# Patient Record
Sex: Female | Born: 1995
Health system: Southern US, Community
[De-identification: ages and names within clinical notes are randomized; demographics above are authoritative.]

## PROBLEM LIST (undated history)

## (undated) DIAGNOSIS — Z789 Other specified health status: Secondary | ICD-10-CM

## (undated) HISTORY — DX: Other specified health status: Z78.9

---

## 2019-08-26 ENCOUNTER — Emergency Department (HOSPITAL_COMMUNITY): Payer: Self-pay

## 2019-08-26 ENCOUNTER — Ambulatory Visit: Payer: Self-pay | Admitting: Orthopedic Surgery

## 2019-08-26 ENCOUNTER — Other Ambulatory Visit: Payer: Self-pay

## 2019-08-26 ENCOUNTER — Emergency Department (HOSPITAL_COMMUNITY): Payer: Self-pay | Admitting: Anesthesiology

## 2019-08-26 ENCOUNTER — Observation Stay (HOSPITAL_COMMUNITY)
Admission: EM | Admit: 2019-08-26 | Discharge: 2019-08-27 | Disposition: A | Discharge status: Home / Self Care | Payer: Self-pay | Attending: Orthopedic Surgery | Admitting: Orthopedic Surgery

## 2019-08-26 ENCOUNTER — Encounter (HOSPITAL_COMMUNITY): Payer: Self-pay

## 2019-08-26 ENCOUNTER — Encounter (HOSPITAL_COMMUNITY)
Admission: EM | Disposition: A | Discharge status: Home / Self Care | Payer: Self-pay | Source: Home / Self Care | Attending: Emergency Medicine

## 2019-08-26 DIAGNOSIS — W228XXA Striking against or struck by other objects, initial encounter: Secondary | ICD-10-CM | POA: Insufficient documentation

## 2019-08-26 DIAGNOSIS — Z20828 Contact with and (suspected) exposure to other viral communicable diseases: Secondary | ICD-10-CM | POA: Insufficient documentation

## 2019-08-26 DIAGNOSIS — Z23 Encounter for immunization: Secondary | ICD-10-CM | POA: Insufficient documentation

## 2019-08-26 DIAGNOSIS — S82402A Unspecified fracture of shaft of left fibula, initial encounter for closed fracture: Secondary | ICD-10-CM | POA: Diagnosis present

## 2019-08-26 DIAGNOSIS — Y990 Civilian activity done for income or pay: Secondary | ICD-10-CM | POA: Diagnosis not present

## 2019-08-26 DIAGNOSIS — S82209A Unspecified fracture of shaft of unspecified tibia, initial encounter for closed fracture: Secondary | ICD-10-CM

## 2019-08-26 DIAGNOSIS — S82202A Unspecified fracture of shaft of left tibia, initial encounter for closed fracture: Principal | ICD-10-CM | POA: Diagnosis present

## 2019-08-26 HISTORY — PX: TIBIA IM NAIL INSERTION: SHX2516

## 2019-08-26 LAB — CBC WITH DIFFERENTIAL/PLATELET
Abs Immature Granulocytes: 0.04 10*3/uL (ref 0.00–0.07)
Basophils Absolute: 0 10*3/uL (ref 0.0–0.1)
Basophils Relative: 0 %
Eosinophils Absolute: 0.1 10*3/uL (ref 0.0–0.5)
Eosinophils Relative: 1 %
HCT: 39.3 % (ref 36.0–46.0)
Hemoglobin: 12.9 g/dL (ref 12.0–15.0)
Immature Granulocytes: 0 %
Lymphocytes Relative: 13 %
Lymphs Abs: 1.5 10*3/uL (ref 0.7–4.0)
MCH: 29.5 pg (ref 26.0–34.0)
MCHC: 32.8 g/dL (ref 30.0–36.0)
MCV: 89.7 fL (ref 80.0–100.0)
Monocytes Absolute: 0.6 10*3/uL (ref 0.1–1.0)
Monocytes Relative: 5 %
Neutro Abs: 9.1 10*3/uL — ABNORMAL HIGH (ref 1.7–7.7)
Neutrophils Relative %: 81 %
Platelets: 309 10*3/uL (ref 150–400)
RBC: 4.38 MIL/uL (ref 3.87–5.11)
RDW: 13.4 % (ref 11.5–15.5)
WBC: 11.3 10*3/uL — ABNORMAL HIGH (ref 4.0–10.5)
nRBC: 0 % (ref 0.0–0.2)

## 2019-08-26 LAB — PROTIME-INR
INR: 1 (ref 0.8–1.2)
Prothrombin Time: 13.1 seconds (ref 11.4–15.2)

## 2019-08-26 LAB — URINALYSIS, ROUTINE W REFLEX MICROSCOPIC
Bilirubin Urine: NEGATIVE
Glucose, UA: NEGATIVE mg/dL
Ketones, ur: NEGATIVE mg/dL
Nitrite: NEGATIVE
Protein, ur: NEGATIVE mg/dL
Specific Gravity, Urine: 1.017 (ref 1.005–1.030)
pH: 6 (ref 5.0–8.0)

## 2019-08-26 LAB — BASIC METABOLIC PANEL
Anion gap: 9 (ref 5–15)
BUN: 12 mg/dL (ref 6–20)
CO2: 23 mmol/L (ref 22–32)
Calcium: 8.6 mg/dL — ABNORMAL LOW (ref 8.9–10.3)
Chloride: 104 mmol/L (ref 98–111)
Creatinine, Ser: 0.67 mg/dL (ref 0.44–1.00)
GFR calc Af Amer: >60 mL/min (ref >60)
GFR calc non Af Amer: >60 mL/min (ref >60)
Glucose, Bld: 101 mg/dL — ABNORMAL HIGH (ref 70–99)
Potassium: 3.6 mmol/L (ref 3.5–5.1)
Sodium: 136 mmol/L (ref 135–145)

## 2019-08-26 LAB — I-STAT BETA HCG BLOOD, ED (MC, WL, AP ONLY): I-stat hCG, quantitative: <5 m[IU]/mL (ref <5)

## 2019-08-26 LAB — SARS CORONAVIRUS 2 BY RT PCR (HOSPITAL ORDER, PERFORMED IN ~~LOC~~ HOSPITAL LAB): SARS Coronavirus 2: NEGATIVE

## 2019-08-26 LAB — SAMPLE TO BLOOD BANK

## 2019-08-26 SURGERY — INSERTION, INTRAMEDULLARY ROD, TIBIA
Anesthesia: General | Laterality: Left

## 2019-08-26 MED ORDER — ROCURONIUM BROMIDE 10 MG/ML (PF) SYRINGE
PREFILLED_SYRINGE | INTRAVENOUS | Status: DC | PRN
  Administered 2019-08-26: 100 mg via INTRAVENOUS

## 2019-08-26 MED ORDER — ONDANSETRON HCL 4 MG/2ML IJ SOLN
INTRAMUSCULAR | Status: AC
  Filled 2019-08-26: qty 2

## 2019-08-26 MED ORDER — ONDANSETRON HCL 4 MG PO TABS: 4.0000 mg | ORAL_TABLET | Freq: Four times a day (QID) | ORAL | Status: DC | PRN

## 2019-08-26 MED ORDER — ENOXAPARIN SODIUM 40 MG/0.4ML ~~LOC~~ SOLN
40.0000 mg | SUBCUTANEOUS | Status: DC
  Administered 2019-08-27: 40 mg via SUBCUTANEOUS
  Filled 2019-08-26: qty 0.4

## 2019-08-26 MED ORDER — BISACODYL 10 MG RE SUPP: 10.0000 mg | Freq: Every day | RECTAL | Status: DC | PRN

## 2019-08-26 MED ORDER — KETOROLAC TROMETHAMINE 30 MG/ML IJ SOLN
INTRAMUSCULAR | Status: DC | PRN
  Administered 2019-08-26: 30 mg via INTRAVENOUS

## 2019-08-26 MED ORDER — HYDROMORPHONE HCL 1 MG/ML IJ SOLN
1.0000 mg | Freq: Once | INTRAMUSCULAR | Status: AC
  Administered 2019-08-26: 14:00:00 1 mg via INTRAVENOUS
  Filled 2019-08-26: qty 1

## 2019-08-26 MED ORDER — HYDROMORPHONE HCL 1 MG/ML IJ SOLN
0.5000 mg | INTRAMUSCULAR | Status: DC | PRN
  Administered 2019-08-26: 1 mg via INTRAVENOUS
  Filled 2019-08-26: qty 1

## 2019-08-26 MED ORDER — MAGNESIUM CITRATE PO SOLN: 1.0000 | Freq: Once | ORAL | Status: DC | PRN

## 2019-08-26 MED ORDER — SODIUM CHLORIDE 0.9 % IV BOLUS
1000.0000 mL | Freq: Once | INTRAVENOUS | Status: AC
  Administered 2019-08-26: 10:00:00 1000 mL via INTRAVENOUS

## 2019-08-26 MED ORDER — MIDAZOLAM HCL 2 MG/2ML IJ SOLN
INTRAMUSCULAR | Status: AC
  Filled 2019-08-26: qty 2

## 2019-08-26 MED ORDER — SODIUM CHLORIDE 0.9 % IV SOLN
INTRAVENOUS | Status: DC
  Administered 2019-08-27: 06:00:00 via INTRAVENOUS

## 2019-08-26 MED ORDER — FENTANYL CITRATE (PF) 250 MCG/5ML IJ SOLN
INTRAMUSCULAR | Status: DC | PRN
  Administered 2019-08-26 (×2): 100 ug via INTRAVENOUS
  Administered 2019-08-26 (×2): 50 ug via INTRAVENOUS
  Administered 2019-08-26: 100 ug via INTRAVENOUS

## 2019-08-26 MED ORDER — OXYCODONE HCL 5 MG PO TABS
10.0000 mg | ORAL_TABLET | ORAL | Status: DC | PRN
  Administered 2019-08-27: 10 mg via ORAL
  Filled 2019-08-26: qty 2

## 2019-08-26 MED ORDER — ONDANSETRON HCL 4 MG/2ML IJ SOLN
4.0000 mg | Freq: Once | INTRAMUSCULAR | Status: AC
  Administered 2019-08-26: 10:00:00 4 mg via INTRAVENOUS
  Filled 2019-08-26: qty 2

## 2019-08-26 MED ORDER — DEXAMETHASONE SODIUM PHOSPHATE 10 MG/ML IJ SOLN
INTRAMUSCULAR | Status: AC
  Filled 2019-08-26: qty 1

## 2019-08-26 MED ORDER — LIDOCAINE 2% (20 MG/ML) 5 ML SYRINGE
INTRAMUSCULAR | Status: DC | PRN
  Administered 2019-08-26: 60 mg via INTRAVENOUS

## 2019-08-26 MED ORDER — OXYCODONE HCL 5 MG PO TABS
5.0000 mg | ORAL_TABLET | Freq: Once | ORAL | Status: AC | PRN
  Administered 2019-08-26: 5 mg via ORAL

## 2019-08-26 MED ORDER — OXYCODONE HCL 5 MG PO TABS: 5.0000 mg | ORAL_TABLET | ORAL | Status: DC | PRN

## 2019-08-26 MED ORDER — KETOROLAC TROMETHAMINE 30 MG/ML IJ SOLN
INTRAMUSCULAR | Status: AC
  Filled 2019-08-26: qty 1

## 2019-08-26 MED ORDER — DIPHENHYDRAMINE HCL 50 MG/ML IJ SOLN
INTRAMUSCULAR | Status: DC | PRN
  Administered 2019-08-26: 25 mg via INTRAVENOUS

## 2019-08-26 MED ORDER — CEFAZOLIN SODIUM-DEXTROSE 2-4 GM/100ML-% IV SOLN
2.0000 g | INTRAVENOUS | Status: AC
  Administered 2019-08-26: 2 g via INTRAVENOUS
  Filled 2019-08-26: qty 100

## 2019-08-26 MED ORDER — TETANUS-DIPHTH-ACELL PERTUSSIS 5-2.5-18.5 LF-MCG/0.5 IM SUSP
0.5000 mL | Freq: Once | INTRAMUSCULAR | Status: AC
  Administered 2019-08-26: 10:00:00 0.5 mL via INTRAMUSCULAR
  Filled 2019-08-26: qty 0.5

## 2019-08-26 MED ORDER — SENNA 8.6 MG PO TABS
1.0000 | ORAL_TABLET | Freq: Two times a day (BID) | ORAL | Status: DC
  Administered 2019-08-26 – 2019-08-27 (×2): 8.6 mg via ORAL
  Filled 2019-08-26 (×2): qty 1

## 2019-08-26 MED ORDER — OXYCODONE HCL 5 MG/5ML PO SOLN: 5.0000 mg | Freq: Once | ORAL | Status: AC | PRN

## 2019-08-26 MED ORDER — ACETAMINOPHEN 500 MG PO TABS
1000.0000 mg | ORAL_TABLET | Freq: Four times a day (QID) | ORAL | Status: DC
  Administered 2019-08-26 – 2019-08-27 (×3): 1000 mg via ORAL
  Filled 2019-08-26 (×3): qty 2

## 2019-08-26 MED ORDER — HYDROMORPHONE HCL 1 MG/ML IJ SOLN
1.0000 mg | Freq: Once | INTRAMUSCULAR | Status: AC
  Administered 2019-08-26: 10:00:00 1 mg via INTRAVENOUS
  Filled 2019-08-26: qty 1

## 2019-08-26 MED ORDER — FENTANYL CITRATE (PF) 250 MCG/5ML IJ SOLN
INTRAMUSCULAR | Status: AC
  Filled 2019-08-26: qty 5

## 2019-08-26 MED ORDER — MIDAZOLAM HCL 5 MG/5ML IJ SOLN
INTRAMUSCULAR | Status: DC | PRN
  Administered 2019-08-26: 2 mg via INTRAVENOUS

## 2019-08-26 MED ORDER — PROPOFOL 10 MG/ML IV BOLUS
INTRAVENOUS | Status: DC | PRN
  Administered 2019-08-26: 200 mg via INTRAVENOUS

## 2019-08-26 MED ORDER — LIDOCAINE 2% (20 MG/ML) 5 ML SYRINGE
INTRAMUSCULAR | Status: AC
  Filled 2019-08-26: qty 5

## 2019-08-26 MED ORDER — DIPHENHYDRAMINE HCL 50 MG/ML IJ SOLN
INTRAMUSCULAR | Status: AC
  Filled 2019-08-26: qty 1

## 2019-08-26 MED ORDER — METHOCARBAMOL 500 MG PO TABS: 500.0000 mg | ORAL_TABLET | Freq: Four times a day (QID) | ORAL | Status: DC | PRN

## 2019-08-26 MED ORDER — POVIDONE-IODINE 10 % EX SWAB: 2.0000 "application " | Freq: Once | CUTANEOUS | Status: DC

## 2019-08-26 MED ORDER — SUCCINYLCHOLINE CHLORIDE 200 MG/10ML IV SOSY
PREFILLED_SYRINGE | INTRAVENOUS | Status: DC | PRN
  Administered 2019-08-26: 120 mg via INTRAVENOUS

## 2019-08-26 MED ORDER — POLYETHYLENE GLYCOL 3350 17 G PO PACK: 17.0000 g | PACK | Freq: Every day | ORAL | Status: DC | PRN

## 2019-08-26 MED ORDER — DEXAMETHASONE SODIUM PHOSPHATE 10 MG/ML IJ SOLN
INTRAMUSCULAR | Status: DC | PRN
  Administered 2019-08-26: 10 mg via INTRAVENOUS

## 2019-08-26 MED ORDER — ONDANSETRON HCL 4 MG/2ML IJ SOLN
INTRAMUSCULAR | Status: DC | PRN
  Administered 2019-08-26: 4 mg via INTRAVENOUS

## 2019-08-26 MED ORDER — 0.9 % SODIUM CHLORIDE (POUR BTL) OPTIME
TOPICAL | Status: DC | PRN
  Administered 2019-08-26: 17:00:00 1000 mL

## 2019-08-26 MED ORDER — FENTANYL CITRATE (PF) 100 MCG/2ML IJ SOLN: 50.0000 ug | INTRAMUSCULAR | Status: DC | PRN

## 2019-08-26 MED ORDER — PROPOFOL 10 MG/ML IV BOLUS
INTRAVENOUS | Status: AC
  Filled 2019-08-26: qty 20

## 2019-08-26 MED ORDER — OXYCODONE HCL 5 MG PO TABS
ORAL_TABLET | ORAL | Status: AC
  Filled 2019-08-26: qty 1

## 2019-08-26 MED ORDER — SUGAMMADEX SODIUM 200 MG/2ML IV SOLN
INTRAVENOUS | Status: DC | PRN
  Administered 2019-08-26: 163.2 mg via INTRAVENOUS

## 2019-08-26 MED ORDER — CHLORHEXIDINE GLUCONATE 4 % EX LIQD
60.0000 mL | Freq: Once | CUTANEOUS | Status: DC
  Filled 2019-08-26: qty 60

## 2019-08-26 MED ORDER — ACETAMINOPHEN 10 MG/ML IV SOLN
INTRAVENOUS | Status: DC | PRN
  Administered 2019-08-26: 1000 mg via INTRAVENOUS

## 2019-08-26 MED ORDER — ACETAMINOPHEN 10 MG/ML IV SOLN
INTRAVENOUS | Status: AC
  Filled 2019-08-26: qty 100

## 2019-08-26 MED ORDER — HYDROMORPHONE HCL 1 MG/ML IJ SOLN: 0.2500 mg | INTRAMUSCULAR | Status: DC | PRN

## 2019-08-26 MED ORDER — CELECOXIB 200 MG PO CAPS
200.0000 mg | ORAL_CAPSULE | Freq: Two times a day (BID) | ORAL | Status: DC
  Administered 2019-08-26 – 2019-08-27 (×2): 200 mg via ORAL
  Filled 2019-08-26 (×3): qty 1

## 2019-08-26 MED ORDER — ONDANSETRON HCL 4 MG/2ML IJ SOLN: 4.0000 mg | Freq: Four times a day (QID) | INTRAMUSCULAR | Status: DC | PRN

## 2019-08-26 MED ORDER — LACTATED RINGERS IV SOLN
INTRAVENOUS | Status: DC
  Administered 2019-08-26: 15:00:00 via INTRAVENOUS

## 2019-08-26 MED ORDER — DOCUSATE SODIUM 100 MG PO CAPS
100.0000 mg | ORAL_CAPSULE | Freq: Two times a day (BID) | ORAL | Status: DC
  Administered 2019-08-26 – 2019-08-27 (×2): 100 mg via ORAL
  Filled 2019-08-26 (×2): qty 1

## 2019-08-26 MED ORDER — DIPHENHYDRAMINE HCL 12.5 MG/5ML PO ELIX: 12.5000 mg | ORAL_SOLUTION | ORAL | Status: DC | PRN

## 2019-08-26 MED ORDER — METHOCARBAMOL 1000 MG/10ML IJ SOLN
500.0000 mg | Freq: Four times a day (QID) | INTRAVENOUS | Status: DC | PRN
  Filled 2019-08-26: qty 5

## 2019-08-26 SURGICAL SUPPLY — 58 items
BANDAGE ESMARK 6X9 LF (GAUZE/BANDAGES/DRESSINGS) ×1 IMPLANT
BIT DRILL 3.8X6 NS (BIT) ×3 IMPLANT
BIT DRILL 4.4 NS (BIT) ×3 IMPLANT
BNDG COHESIVE 4X5 TAN STRL (GAUZE/BANDAGES/DRESSINGS) ×3 IMPLANT
BNDG COHESIVE 6X5 TAN STRL LF (GAUZE/BANDAGES/DRESSINGS) ×3 IMPLANT
BNDG ELASTIC 6X10 VLCR STRL LF (GAUZE/BANDAGES/DRESSINGS) ×3 IMPLANT
BNDG ESMARK 6X9 LF (GAUZE/BANDAGES/DRESSINGS) ×3
COVER SURGICAL LIGHT HANDLE (MISCELLANEOUS) ×3 IMPLANT
COVER WAND RF STERILE (DRAPES) ×3 IMPLANT
CUFF TOURN SGL QUICK 42 (TOURNIQUET CUFF) ×3 IMPLANT
DRAPE C-ARM 42X72 X-RAY (DRAPES) ×3 IMPLANT
DRAPE C-ARMOR (DRAPES) ×3 IMPLANT
DRAPE IMP U-DRAPE 54X76 (DRAPES) ×3 IMPLANT
DRAPE ORTHO SPLIT 77X108 STRL (DRAPES) ×2
DRAPE SURG ORHT 6 SPLT 77X108 (DRAPES) ×1 IMPLANT
DRAPE U-SHAPE 47X51 STRL (DRAPES) ×3 IMPLANT
DRSG ADAPTIC 3X8 NADH LF (GAUZE/BANDAGES/DRESSINGS) IMPLANT
DRSG MEPILEX BORDER 4X4 (GAUZE/BANDAGES/DRESSINGS) ×9 IMPLANT
DURAPREP 26ML APPLICATOR (WOUND CARE) ×6 IMPLANT
ELECT REM PT RETURN 9FT ADLT (ELECTROSURGICAL) ×3
ELECTRODE REM PT RTRN 9FT ADLT (ELECTROSURGICAL) ×1 IMPLANT
GAUZE SPONGE 4X4 12PLY STRL (GAUZE/BANDAGES/DRESSINGS) IMPLANT
GLOVE BIO SURGEON STRL SZ8 (GLOVE) ×6 IMPLANT
GLOVE BIOGEL PI IND STRL 7.5 (GLOVE) ×1 IMPLANT
GLOVE BIOGEL PI IND STRL 8 (GLOVE) ×1 IMPLANT
GLOVE BIOGEL PI INDICATOR 7.5 (GLOVE) ×2
GLOVE BIOGEL PI INDICATOR 8 (GLOVE) ×2
GOWN STRL REUS W/ TWL LRG LVL3 (GOWN DISPOSABLE) ×3 IMPLANT
GOWN STRL REUS W/TWL LRG LVL3 (GOWN DISPOSABLE) ×6
GUIDEPIN 3.2X17.5 THRD DISP (PIN) ×3 IMPLANT
GUIDEWIRE BALL NOSE 80CM (WIRE) ×3 IMPLANT
KIT BASIN OR (CUSTOM PROCEDURE TRAY) ×3 IMPLANT
KIT TURNOVER KIT B (KITS) ×3 IMPLANT
NAIL IM TIBIAL 10X34.5 (Orthopedic Implant) ×1 IMPLANT
PACK ORTHO EXTREMITY (CUSTOM PROCEDURE TRAY) ×3 IMPLANT
PACK UNIVERSAL I (CUSTOM PROCEDURE TRAY) ×3 IMPLANT
PAD ARMBOARD 7.5X6 YLW CONV (MISCELLANEOUS) ×6 IMPLANT
PAD CAST 4YDX4 CTTN HI CHSV (CAST SUPPLIES) IMPLANT
PADDING CAST COTTON 4X4 STRL (CAST SUPPLIES)
PADDING CAST COTTON 6X4 STRL (CAST SUPPLIES) IMPLANT
SCREW ACECAP 36MM (Screw) ×3 IMPLANT
SCREW ACECAP 46MM (Screw) ×3 IMPLANT
SCREW PROXIMAL DEPUY (Screw) ×4 IMPLANT
SCREW PRXML FT 50X5.5XLCK NS (Screw) ×2 IMPLANT
SPONGE LAP 18X18 RF (DISPOSABLE) ×3 IMPLANT
STAPLER VISISTAT 35W (STAPLE) ×3 IMPLANT
SUT ETHILON 3 0 PS 1 (SUTURE) ×3 IMPLANT
SUT MNCRL AB 3-0 PS2 18 (SUTURE) ×3 IMPLANT
SUT MON AB 2-0 CT1 27 (SUTURE) ×3 IMPLANT
SUT VIC AB 0 CT1 27 (SUTURE) ×2
SUT VIC AB 0 CT1 27XBRD ANBCTR (SUTURE) ×1 IMPLANT
TIBIAL NAIL 10X34.5 (Orthopedic Implant) ×3 IMPLANT
TOWEL GREEN STERILE (TOWEL DISPOSABLE) ×3 IMPLANT
TOWEL GREEN STERILE FF (TOWEL DISPOSABLE) ×3 IMPLANT
TUBE CONNECTING 12'X1/4 (SUCTIONS) ×1
TUBE CONNECTING 12X1/4 (SUCTIONS) ×2 IMPLANT
UNDERPAD 30X30 (UNDERPADS AND DIAPERS) ×3 IMPLANT
YANKAUER SUCT BULB TIP NO VENT (SUCTIONS) ×3 IMPLANT
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
## 2019-08-26 NOTE — ED Notes (Signed)
Pt transported to Radiology at this time. 
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
## 2019-08-26 NOTE — Anesthesia Preprocedure Evaluation (Signed)
Anesthesia Evaluation  Patient identified by MRN, date of birth, ID band Patient awake    Reviewed: Allergy & Precautions, H&P , NPO status , Patient's Chart, lab work & pertinent test results  Airway Mallampati: I   Neck ROM: full    Dental   Pulmonary neg pulmonary ROS,    breath sounds clear to auscultation       Cardiovascular negative cardio ROS   Rhythm:regular Rate:Normal     Neuro/Psych    GI/Hepatic   Endo/Other    Renal/GU      Musculoskeletal   Abdominal   Peds  Hematology   Anesthesia Other Findings   Reproductive/Obstetrics                             Anesthesia Physical Anesthesia Plan  ASA: I  Anesthesia Plan: General   Post-op Pain Management:    Induction: Intravenous  PONV Risk Score and Plan: 3 and Ondansetron, Dexamethasone, Midazolam and Treatment may vary due to age or medical condition  Airway Management Planned: Oral ETT  Additional Equipment:   Intra-op Plan:   Post-operative Plan: Extubation in OR  Informed Consent: I have reviewed the patients History and Physical, chart, labs and discussed the procedure including the risks, benefits and alternatives for the proposed anesthesia with the patient or authorized representative who has indicated his/her understanding and acceptance.       Plan Discussed with: CRNA, Anesthesiologist and Surgeon  Anesthesia Plan Comments:         Anesthesia Quick Evaluation

## 2019-08-26 NOTE — Anesthesia Procedure Notes (Signed)
Procedure Name: Intubation Date/Time: 08/26/2019 4:00 PM Performed by: Renato Shin, CRNA Pre-anesthesia Checklist: Patient identified, Emergency Drugs available, Suction available and Patient being monitored Patient Re-evaluated:Patient Re-evaluated prior to induction Oxygen Delivery Method: Circle system utilized Preoxygenation: Pre-oxygenation with 100% oxygen Induction Type: IV induction Ventilation: Mask ventilation without difficulty Laryngoscope Size: Miller and 2 Grade View: Grade I Tube type: Oral Tube size: 7.0 mm Number of attempts: 1 Airway Equipment and Method: Stylet and Oral airway Placement Confirmation: ETT inserted through vocal cords under direct vision,  positive ETCO2 and breath sounds checked- equal and bilateral Secured at: 21 cm Tube secured with: Tape Dental Injury: Teeth and Oropharynx as per pre-operative assessment

## 2019-08-26 NOTE — Progress Notes (Signed)
Orthopedic Tech Progress Note Patient Details:  Allison Gray 1996-10-08 [id number]  Ortho Devices Type of Ortho Device: CAM walker Ortho Device/Splint Location: LLE Ortho Device/Splint Interventions: Adjustment, Application, Ordered   Post Interventions Patient Tolerated: Well Instructions Provided: Care of device, Adjustment of device   Janit Pagan 08/26/2019, 6:35 PM

## 2019-08-26 NOTE — ED Notes (Signed)
Patient transported to X-ray 

## 2019-08-26 NOTE — Plan of Care (Signed)

## 2019-08-26 NOTE — ED Notes (Signed)
All clothing removed, pt ready for transport to short stay #36
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
## 2019-08-26 NOTE — Progress Notes (Signed)
Orthopedic Tech Progress Note Patient Details:  Allison Gray 1996/02/22 [id number] Applied with RN at bedside.Manson Passey Devices Type of Ortho Device: Short leg splint Ortho Device/Splint Location: LLE Ortho Device/Splint Interventions: Adjustment, Application, Ordered   Post Interventions Patient Tolerated: Fair Instructions Provided: Care of device, Adjustment of device   Janit Pagan 08/26/2019, 2:37 PM

## 2019-08-26 NOTE — ED Notes (Signed)
Pt SO has pt belongings.

## 2019-08-26 NOTE — ED Notes (Signed)
Paged ortho for splinting
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
## 2019-08-26 NOTE — ED Notes (Signed)
Pharm at bedside

## 2019-08-26 NOTE — Transfer of Care (Signed)
Immediate Anesthesia Transfer of Care Note  Patient: Allison Gray  Procedure(s) Performed: INTRAMEDULLARY (IM) NAIL TIBIAL FRACTURE (Left )  Patient Location: PACU  Anesthesia Type:General  Level of Consciousness: awake, drowsy and patient cooperative  Airway & Oxygen Therapy: Patient Spontanous Breathing and Patient connected to nasal cannula oxygen  Post-op Assessment: Report given to RN and Post -op Vital signs reviewed and stable  Post vital signs: Reviewed and stable  Last Vitals:  Vitals Value Taken Time  BP    Temp    Pulse    Resp    SpO2      Last Pain:  Vitals:   08/26/19 1451  TempSrc:   PainSc: 4          Complications: No apparent anesthesia complications

## 2019-08-27 ENCOUNTER — Encounter (HOSPITAL_COMMUNITY): Payer: Self-pay | Admitting: Orthopedic Surgery

## 2019-08-27 MED ORDER — ENOXAPARIN SODIUM 40 MG/0.4ML ~~LOC~~ SOLN: 40.0000 mg | SUBCUTANEOUS | 0 refills | Status: DC

## 2019-08-27 MED ORDER — SENNA 8.6 MG PO TABS: 2.0000 | ORAL_TABLET | Freq: Two times a day (BID) | ORAL | 0 refills | Status: DC

## 2019-08-27 MED ORDER — INFLUENZA VAC SPLIT QUAD 0.5 ML IM SUSY
0.5000 mL | PREFILLED_SYRINGE | Freq: Once | INTRAMUSCULAR | Status: AC
  Administered 2019-08-27: 0.5 mL via INTRAMUSCULAR
  Filled 2019-08-27: qty 0.5

## 2019-08-27 MED ORDER — DOCUSATE SODIUM 100 MG PO CAPS: 100.0000 mg | ORAL_CAPSULE | Freq: Two times a day (BID) | ORAL | 0 refills | Status: DC

## 2019-08-27 MED ORDER — OXYCODONE HCL 5 MG PO TABS: 5.0000 mg | ORAL_TABLET | ORAL | 0 refills | Status: AC | PRN

## 2019-08-27 MED FILL — ENOXAPARIN SODIUM 40 MG/0.4: 40 | 15 days supply | Qty: 6 | Fill #0

## 2019-08-27 NOTE — Progress Notes (Signed)
Orthopedic Tech Progress Note Patient Details:  Allison Gray 01/12/1996 [id number]  Ortho Devices Type of Ortho Device: Crutches Ortho Device/Splint Location: LLE Ortho Device/Splint Interventions: Application   Post Interventions Patient Tolerated: Well Instructions Provided: Care of device, Poper ambulation with device   [place] Pink 08/27/2019, 11:26 AM

## 2019-08-27 NOTE — Plan of Care (Signed)
°  Problem: Education: °Goal: Knowledge of General Education information will improve °Description: Including pain rating scale, medication(s)/side effects and non-pharmacologic comfort measures °Outcome: Progressing °  °Problem: Clinical Measurements: °Goal: Will remain free from infection °Outcome: Progressing °  °Problem: Activity: °Goal: Risk for activity intolerance will decrease °Outcome: Progressing °  °Problem: Elimination: °Goal: Will not experience complications related to bowel motility °Outcome: Progressing °  °Problem: Pain Managment: °Goal: General experience of comfort will improve °Outcome: Progressing °  °Problem: Safety: °Goal: Ability to remain free from injury will improve °Outcome: Progressing °  °

## 2019-08-27 NOTE — Progress Notes (Signed)
Subjective: 1 Day Post-Op Procedure(s) (LRB): INTRAMEDULLARY (IM) NAIL TIBIAL FRACTURE (Left)  Patient reports pain as mild to moderate.  Resting comfortably in bed. Denies fever, chills, N/V, CP, SOB.  Tolerating POs well.    Objective:   VITALS:  Temp:  [97 F (36.1 C)-98.6 F (37 C)] 98.4 F (36.9 C) (09/28 0434) Pulse Rate:  [66-99] 77 (09/28 0434) Resp:  [15-22] 16 (09/27 1820) BP: (111-151)/(63-87) 111/67 (09/28 0434) SpO2:  [94 %-100 %] 98 % (09/28 0434) Weight:  [81.6 kg] 81.6 kg (09/27 0957)  General: WDWN patient in NAD. Psych:  Appropriate mood and affect. Neuro:  A&O x 3, Moving all extremities, sensation intact to light touch HEENT:  EOMs intact Chest:  Even non-labored respirations Skin: CAM boot and dressing C/D/I, no rashes or lesions Extremities: warm/dry, mild edema, no erythema or echymosis.  No lymphadenopathy. Pulses: Popliteus 2+ MSK:  ROM: EHL/FHL intact, MMT: able to perform quad set    LABS Recent Labs    08/26/19 1030  HGB 12.9  WBC 11.3*  PLT 309   Recent Labs    08/26/19 1030  NA 136  K 3.6  CL 104  CO2 23  BUN 12  CREATININE 0.67  GLUCOSE 101*   Recent Labs    08/26/19 1030  INR 1.0     Assessment/Plan: 1 Day Post-Op Procedure(s) (LRB): INTRAMEDULLARY (IM) NAIL TIBIAL FRACTURE (Left)  WBAT L LE in CAM boot Up with therapy Lovenox for DVT ppx upon D/C. D/C home today after working with therapy. D/C scripts sent to patient's pharmacy.  Mechele Claude PA-C [facility] Office:  [phone]

## 2019-08-27 NOTE — Progress Notes (Signed)
Gave hand-off report to Arlice Colt, RN. Patient transferred to [facility] to await discharge.    Patient belongings sent with significant other of patient who was visiting at the time of transfer. Patient's questions and concerns answered and

## 2019-08-27 NOTE — TOC Transition Note (Addendum)
Transition of Care [facility]) - CM/SW Discharge Note   Patient Details  Name: Allison Gray MRN: [id number] Date of Birth: July 26, 1996  [facility] CM/SW Contact:  Midge Minium RN, BSN, NCM-BC, ACM-RN [phone] Phone Number: 08/27/2019, 12:17 PM   Clinical Narrative:    CM following for dispositional needs. CM spoke to the patient utilizing Interpreter services via [place] [phone]. Patient lives at home and verified as having no health insurance, nor an established PCP. PT/OT eval completed with Outpatient PT recommended/DME, with patient agreeable. CM sent an Outpatient PT referral to [facility] outpatient rehab. Patient stated she unable to afford her Rxs and doesn't have anyone available to assist her with obtaining her DC medications. CM will utilize [facility] for her Lovenox (does not include narcotics), with the patient informed, nor OTC prescriptions. [facility] DME will be provided by [place]. DME referral given to Learta Codding; Huey Romans liaison; AVS updated. Patients Rx will be delivered prior to her discharging home; DME will be delivered to her home. No further needs from CM.   Final next level of care: Home/Self Care(Outpatient PT) Barriers to Discharge: No Barriers Identified   Patient Goals and CMS Choice Patient states their goals for this hospitalization and ongoing recovery are:: "to get better" CMS Medicare.gov Compare Post Acute Care list provided to:: Other (Comment Required)([facility] DME) Choice offered to / list presented to : NA([facility] DME)  Discharge Plan and Services In-house Referral: Interpreting Services, PCP / [facility] Discharge Planning Services: CM Consult, [facility] Program Post Acute Care Choice: Durable Medical Equipment          DME Arranged: Bedside commode, Tub bench DME Agency: [place] Date DME Agency Contacted: 08/27/19 Time DME Agency Contacted: 1216 Representative spoke with at DME Agency: Learta Codding (liaison) [place] Arranged: NA [place] Agency:  NA        Social Determinants of Health ([place]) Interventions     Readmission Risk Interventions No flowsheet data found.
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
## 2019-08-27 NOTE — Plan of Care (Signed)
RN educated pt and family member about discharge instructions with Optometrist. Pt and family understanding of instructions.

## 2019-08-27 NOTE — Discharge Instructions (Addendum)
Cuidados del yeso o de la frula en los adultos Cast or Splint Care, Adult Los yesos y las frulas son soportes que se utilizan para proteger los [place] rotos y Management consultant. Un yeso o una frula mantienen el hueso firme y en la posicin correcta [place] se [place]. Los yesos y las frulas tambin ayudan a Engineer, materials, la hinchazn y los espasmos musculares. Cmo cuidar su yeso   No introduzca nada dentro del yeso para rascarse la piel.  Controle todos los [employer] piel de alrededor del yeso. Informe al mdico si tiene alguna inquietud.  Puede aplicar una locin en la piel seca alrededor de los bordes del yeso. No se aplique locin en la piel por debajo del yeso.  Mantenga el yeso limpio.  Si el yeso no es impermeable: ? No deje que se moje. ? Cbralo con un envoltorio hermtico cuando tome un bao de inmersin o [place]. Cmo cuidar su frula   sela como se lo haya indicado el mdico. Qutesela solamente como se lo haya indicado el mdico.  Afloje la frula si los dedos de la mano o de los pies se le adormecen, si siente hormigueo o si se le enfran y se tornan de Research officer, trade union.  Mantenga la frula limpia.  Si la frula no es impermeable: ? No deje que se moje. ? Cbrala con un envoltorio hermtico cuando tome un bao de inmersin o [place]. Siga estas indicaciones en su casa: Baarse  No practique natacin, no se de baos de inmersin ni duchas, excepto que su mdico lo autorice. Pregntele al mdico si puede ducharse. Delle Reining solo le permitan tomar baos de [place].  Si el yeso o la frula no son impermeables, cbralos con un envoltorio hermtico cuando tome un bao de inmersin o una ducha. Control del dolor, la rigidez y Regulatory affairs officer los dedos de la mano o del pie con frecuencia para evitar la rigidez y reducir la hinchazn.  Cuando est sentado o acostado, eleve la zona de la lesin por encima del nivel del corazn. Seguridad  No apoye el peso del  cuerpo sobre la extremidad [employer] que el mdico se lo autorice.  Use muletas u otros dispositivos de [employer] se lo haya indicado su mdico. Instrucciones generales  No ejerza presin en ninguna parte del yeso o de la frula hasta que se hayan endurecido por completo. Esto puede tardar muchas horas.  Retome sus actividades habituales como se lo haya indicado el mdico. Pregntele al mdico qu actividades son seguras para usted.  Concurra a todas las visitas de [address] se lo haya indicado el mdico. Esto es importante. Comunquese con un mdico si:  El yeso o la frula se daan.  La piel alrededor del yeso se enrojece o est en carne viva.  La piel debajo del yeso le pica o le duele mucho.  El yeso o la frula se sienten muy incmodos.  Siente que el yeso o la frula estn muy apretados o muy flojos.  El yeso se moja o comienza a tener una zona blanda.  Algn objeto se queda atascado bajo el yeso. Solicite ayuda de inmediato si:  El Product/process development scientist.  Siente hormigueo o entumecimiento en la zona lesionada, o esta se torna de color azul o se enfra.  La parte del cuerpo por encima o por debajo del yeso est hinchada y se pone de un color diferente (se decolora).  No puede mover ni sentir los  dedos.  Le sale un lquido por el yeso.  Tiene dolor o presin muy fuertes debajo del yeso.  Tiene dificultad para respirar.  Le falta el aire.  Siente dolor en el pecho. Esta informacin no tiene Theme park managercomo fin reemplazar el consejo del mdico. Asegrese de hacerle al mdico cualquier pregunta que tenga. Document Released: 04/19/2011 Document Revised: 06/06/2017 Document Reviewed: 05/08/2016 Elsevier Patient Education  2020 Elsevier Inc.   Uso de clavos intramedulares en fracturas diafisarias de tibia, cuidados posteriores Intramedullary Nailing of Tibial Diaphyseal Fracture, Care After Lea esta informacin sobre cmo cuidarse despus del procedimiento. Su mdico  tambin podr darle indicaciones ms especficas. Comunquese con su mdico si tiene problemas o preguntas. Qu puedo esperar despus del procedimiento? Despus del procedimiento, es comn DIRECTVtener los siguientes sntomas:  Dolor e hinchazn en la pantorrilla. Siga estas indicaciones en su casa: Medicamentos  Baxter Internationalome los medicamentos de venta libre y los recetados solamente como se lo haya indicado el mdico.  Si le recetaron un antibitico, tmelo como se lo haya indicado el mdico. No deje de tomar el antibitico aunque comience a sentirse mejor.  No conduzca vehculos ni opere maquinaria pesada mientras tome medicamentos recetados, a menos que el mdico lo haya aprobado. Si tiene una frula:  Use la frula como se lo haya indicado el mdico. Qutesela solamente como se lo haya indicado el mdico.  Afloje la frula si los dedos de los pies se le entumecen, siente hormigueos o se le enfran y se tornan de Research officer, trade unioncolor azul.  Mantenga la frula limpia.  Si la frula no es impermeable: ? No deje que se moje. ? Cbrala con un envoltorio hermtico cuando tome una ducha. Baarse  No tome baos de inmersin, no nade ni use el jacuzzi hasta que el mdico lo autorice. Delle Reiningal vez solo Contractorle permitan tomar baos de Arrow Electronicsesponja durante los Entergy Corporationprimeros das.  Pregntele al mdico si puede ducharse.  Mantenga la venda (vendaje) seca hasta que el mdico le diga que se la puede quitar. Cuidados de la incisin   Siga las indicaciones del mdico acerca del cuidado de la incisin. Haga lo siguiente: ? Lvese las manos con agua y jabn antes de Multimedia programmercambiar las vendas (vendajes). Use desinfectante para manos si no dispone de [place] y [place]. ? Cambie los vendajes como se lo haya indicado el mdico. ? No retire los puntos (suturas), la goma para cerrar la piel o las tiras Wakondaadhesivas. Es posible que estos cierres cutneos Conservation officer, naturedeban permanecer en la piel durante 2semanas o ms. Si los bordes de las tiras [address]ad[address]ivas empiezan a despegarse  y Scientific laboratory technicianenroscarse, puede recortar los que estn sueltos. No retire las tiras Agilent Technologiesadhesivas por completo a menos que el mdico se lo indique.  Controle todos los das la zona de la incisin para detectar signos de infeccin. Est atento a los siguientes signos: ? Aumento del enrojecimiento, de la hinchazn o del dolor. ? Mayor presencia de lquido o [place]. ? Calor. ? Pus o mal olor. Control del dolor, la rigidez y la hinchazn   Cuando est sentado o acostado, alce (eleve) la zona de la lesin por encima del nivel del corazn.  Mueva los dedos de los pies con frecuencia para evitar que se entumezcan y para reducir la hinchazn.  Si se lo indican, aplique hielo sobre la zona de la lesin. ? Ponga el hielo en una bolsa plstica. ? Coloque una FirstEnergy Corptoalla entre la piel y la bolsa de hielo. ? Coloque el hielo durante 20minutos, 2 a 3veces por  da. Actividad  No apoye el peso del cuerpo sobre el miembro lesionado. Quizs pueda apoyar algo de peso en la pierna (apoyo parcial del peso). Utilice muletas o un andador. Siga las indicaciones del mdico.  Evite estar sentado o acostado durante perodos largos sin moverse. Le recomendarn que camine con ayuda lo ms pronto posible. Esto contribuye a prevenir cogulos sanguneos.  Si le indicaron fisioterapia, haga los ejercicios como se lo hayan indicado. Es importante hacer fisioterapia para Astronomer y la amplitud de movimiento de la pierna.  Haga reposo y evite las actividades que requieren mucho esfuerzo o que lo ponen en riesgo de caerse o lastimarse la pierna otra vez. Pregntele al mdico qu actividades puede hacer y cundo puede comenzar a Merchant navy officer pierna despus de la [place]. Instrucciones generales  A fin de prevenir o tratar el estreimiento mientras toma analgsicos recetados, el mdico puede recomendarle lo siguiente: ? Beba suficiente lquido como para mantener la orina clara o de color amarillo plido. ? Tome medicamentos  recetados o de venta [place]. ? Consuma alimentos ricos en fibra, como frutas y verduras frescas, cereales integrales y frijoles. ? Limite el consumo de alimentos ricos en grasa y azcares procesados, como alimentos fritos o dulces.  No consuma ningn producto que contenga nicotina o tabaco, como cigarrillos y Administrator, Civil Service. Estos pueden retrasar la consolidacin del [place]. Si necesita ayuda para dejar de fumar, consulte al mdico.  Tome medidas para evitar caerse en su hogar, por ejemplo, quite los tapetes y los elementos con los que pueda tropezarse.  Concurra a todas las visitas de [address] se lo haya indicado el mdico. Esto es importante. Comunquese con un mdico si:  Aumentan el enrojecimiento, la hinchazn o el dolor alrededor de la incisin.  Le sale ms lquido o sangre de la incisin.  La incisin est caliente al tacto.  Tiene pus o percibe que sale mal olor del lugar de la incisin.  Tiene fiebre. Solicite ayuda de inmediato si:  La incisin se abre.  Presenta zonas hinchadas, enrojecidas y dolorosas en una o ambas piernas.  Tiene un dolor intenso que no mejora con medicamentos. Resumen  Despus del procedimiento, es frecuente sentir dolor y tener hinchazn en la pantorrilla.  Debe controlar todos los [employer] lugar de la incisin para Engineer, manufacturing signos de infeccin, como enrojecimiento o hinchazn.  No apoye el peso del cuerpo sobre el miembro lesionado. Quizs pueda apoyar algo de peso en la pierna (apoyo parcial del peso). Utilice muletas o un andador. Siga las indicaciones del mdico.  Pregntele al mdico qu actividades son seguras para usted en el perodo de recuperacin. Es importante hacer fisioterapia segn se lo indiquen para Astronomer y la amplitud de movimiento de la pierna.  Concurra a todas las visitas de [address] se lo haya indicado el mdico. Esto es importante. Esta informacin no tiene Microbiologist el consejo del mdico. Asegrese de hacerle al mdico cualquier pregunta que tenga. Document Released: 11/20/2013 Document Revised: 08/18/2017 Document Reviewed: 08/18/2017 Elsevier Patient Education  2020 Elsevier Inc. Toni Arthurs, MD [facility]  Please read the following information regarding your care after surgery.  Medications  You only need a prescription for the narcotic pain medicine (ex. oxycodone, Percocet, Norco).  All of the other medicines listed below are available over the counter. X Aleve 2 pills twice a day for the first 3 days after surgery. X acetominophen (Tylenol) 650 mg every 4-6 hours as you need  for minor to moderate pain X oxycodone as prescribed for severe pain  Narcotic pain medicine (ex. oxycodone, Percocet, Vicodin) will cause constipation.  To prevent this problem, take the following medicines while you are taking any pain medicine. X docusate sodium (Colace) 100 mg twice a day X senna (Senokot) 2 tablets twice a day  X To help prevent blood clots, take a Lovenox shot once a day for two weeks after surgery.  You should also get up every hour while you are awake to move around.    Weight Bearing X Bear weight when you are able on your operated leg or foot.   Cast / Splint / Dressing X Keep your splint, cast or dressing clean and dry.  Dont put anything (coat hanger, pencil, etc) down inside of it.  If it gets damp, use a hair dryer on the cool setting to dry it.  If it gets soaked, call the office to schedule an appointment for a cast change.   After your dressing, cast or splint is removed; you may shower, but do not soak or scrub the wound.  Allow the water to run over it, and then gently pat it dry.  Swelling It is normal for you to have swelling where you had surgery.  To reduce swelling and pain, keep your toes above your nose for at least 3 days after surgery.  It may be necessary to keep your foot or leg elevated for several weeks.  If  it hurts, it should be elevated.  Follow Up Call my office at [phone] when you are discharged from the hospital or surgery center to schedule an appointment to be seen two weeks after surgery.  Call my office at [phone] if you develop a fever >101.5 F, nausea, vomiting, bleeding from the surgical site or severe pain.

## 2019-08-27 NOTE — Discharge Summary (Addendum)
Physician Discharge Summary  Patient ID: Allison Gray MRN: [id number] DOB/AGE: 19-Dec-1995 23 y.o.  Admit date: 08/26/2019 Discharge date: 08/27/2019  Admission Diagnoses: Closed left tibia and fibula fxs, displaced;   Discharge Diagnoses:  Active Problems:   Closed fracture of shaft of tibia and fibula, left, initial encounter   Discharged Condition: stable  Hospital Course: Patient presented to [facility] ED on 08/26/2019 after sustaining a work injury.  A roller belt was hit by something in a big piece of metal hit her left leg.  She was unable to bear weight.  She presented to the ED via EMS.  Radiographs of her left tibia and fibula were obtained that demonstrated a displaced tibia/fibula fracture.  Dr. Toni Arthurs was then consulted.  The patient was then taken to the OR for IM nailing of the displaced tibia fracture by Dr. Toni Arthurs.  The patient tolerated the procedure well without any complications.  She was then admitted to the hospital.  She worked well with therapy.  She tolerated her stay well without complication.  She is to be discharged home on August 27, 2019.  Consults: PT/OT  Significant Diagnostic Studies: radiology: X-Ray: for diagnosis, and to ensure satisfactory anatomic aligment during operative procedure.  Treatments: IV hydration, antibiotics: Ancef, analgesia: acetaminophen, acetaminophen w/ codeine, Vicodin and Dilaudid, anticoagulation: lovenox and surgery: as stated above  Discharge Exam: Blood pressure 118/68, pulse 76, temperature 98.6 F (37 C), temperature source Oral, resp. rate 18, height 5' 8.9" (1.75 m), weight 81.6 kg, last menstrual period 08/19/2019, SpO2 96 %. General: WDWN patient in NAD. Psych:  Appropriate mood and affect. Neuro:  A&O x 3, Moving all extremities, sensation intact to light touch HEENT:  EOMs intact Chest:  Even non-labored respirations Skin:  CAM boot and dressing C/D/I, no rashes or lesions Extremities: warm/dry, mild  edema, no erythema or echymosis.  No lymphadenopathy. Pulses: Popliteus 2+ MSK:  ROM: EHL/FHL intact, MMT: able to perform quad set   Disposition: Discharge disposition: 01-Home or Self Care       Discharge Instructions    Ambulatory referral to Physical Therapy   Complete by: As directed    When appropriate for ROM and strengthening   Call MD / Call 911   Complete by: As directed    If you experience chest pain or shortness of breath, CALL 911 and be transported to the hospital emergency room.  If you develope a fever above 101 F, pus (white drainage) or increased drainage or redness at the wound, or calf pain, call your surgeon's office.   Constipation Prevention   Complete by: As directed    Drink plenty of fluids.  Prune juice may be helpful.  You may use a stool softener, such as Colace (over the counter) 100 mg twice a day.  Use MiraLax (over the counter) for constipation as needed.   Diet - low sodium heart healthy   Complete by: As directed    Increase activity slowly as tolerated   Complete by: As directed    Weight bearing as tolerated   Complete by: As directed    In CAM boot.   Laterality: left   Extremity: Lower     Allergies as of 08/27/2019   No Known Allergies     Medication List    TAKE these medications   docusate sodium 100 MG capsule Commonly known as: Colace Take 1 capsule (100 mg total) by mouth 2 (two) times daily. While taking narcotic pain medicine.  docusate sodium 100 MG capsule Commonly known as: Colace Take 1 capsule (100 mg total) by mouth 2 (two) times daily. While taking narcotic pain medicine.   enoxaparin 40 MG/0.4ML injection Commonly known as: LOVENOX Inject 0.4 mLs (40 mg total) into the skin daily.   enoxaparin 40 MG/0.4ML injection Commonly known as: LOVENOX Inject 0.4 mLs (40 mg total) into the skin daily.   oxyCODONE 5 MG immediate release tablet Commonly known as: Roxicodone Take 1 tablet (5 mg total) by mouth every 4  (four) hours as needed for up to 5 days for moderate pain or severe pain.   oxyCODONE 5 MG immediate release tablet Commonly known as: Roxicodone Take 1 tablet (5 mg total) by mouth every 4 (four) hours as needed for up to 5 days for moderate pain or severe pain.   senna 8.6 MG Tabs tablet Commonly known as: SENOKOT Take 2 tablets (17.2 mg total) by mouth 2 (two) times daily.   senna 8.6 MG Tabs tablet Commonly known as: SENOKOT Take 2 tablets (17.2 mg total) by mouth 2 (two) times daily.            Durable Medical Equipment  (From admission, onward)         Start     Ordered   08/27/19 1114  For home use only DME Bedside commode  Once    Question:  Patient needs a bedside commode to treat with the following condition  Answer:  Physical deconditioning   08/27/19 1114   08/27/19 1114  For home use only DME Tub bench  Once     08/27/19 1114           Discharge Care Instructions  (From admission, onward)         Start     Ordered   08/27/19 0000  Weight bearing as tolerated    Comments: In CAM boot.  Question Answer Comment  Laterality left   Extremity Lower      08/27/19 0758         Follow-up Information    Wylene Simmer, MD. Schedule an appointment as soon as possible for a visit in 2 weeks.   Specialty: Orthopedic Surgery Contact information: [address] [place] [address] [place]. Schedule an appointment as soon as possible for a visit.   Why: To schedule a follow-up appointment and for obtaining a primary care provider Contact information: [place] [id number] [place], [facility] Follow up.   Why: Bedside commode; tub bench Contact information: [place] [address] [phone]        [facility] Follow up.   Specialty: Rehabilitation Why: Someone will call  for scheduling; please call if a rep has not contacted you in 2-3 business days.  Contact information: [address] [id number] [place] [phone]          Signed: Mohammed Kindle Office:  [phone]
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
## 2019-08-28 NOTE — Anesthesia Postprocedure Evaluation (Signed)
Anesthesia Post Note  Patient: Allison Gray  Procedure(s) Performed: INTRAMEDULLARY (IM) NAIL TIBIAL FRACTURE (Left )     Patient location during evaluation: PACU Anesthesia Type: General Level of consciousness: awake and alert Pain management: pain level controlled Vital Signs Assessment: post-procedure vital signs reviewed and stable Respiratory status: spontaneous breathing, nonlabored ventilation, respiratory function stable and patient connected to nasal cannula oxygen Cardiovascular status: blood pressure returned to baseline and stable Postop Assessment: no apparent nausea or vomiting Anesthetic complications: no    Last Vitals:  Vitals:   08/27/19 0845 08/27/19 1302  BP: 118/68 128/71  Pulse: 76 86  Resp: 18 18  Temp: 37 C 36.8 C  SpO2: 96% 100%    Last Pain:  Vitals:   08/27/19 1302  TempSrc: Oral  PainSc:                  [place] S

## 2019-09-05 ENCOUNTER — Ambulatory Visit: Payer: Self-pay | Attending: Orthopedic Surgery | Admitting: Physical Therapy

## 2019-09-05 ENCOUNTER — Encounter: Payer: Self-pay | Admitting: Physical Therapy

## 2019-09-05 ENCOUNTER — Other Ambulatory Visit: Payer: Self-pay

## 2019-09-05 DIAGNOSIS — M79662 Pain in left lower leg: Secondary | ICD-10-CM | POA: Diagnosis present

## 2019-09-05 DIAGNOSIS — R2689 Other abnormalities of gait and mobility: Secondary | ICD-10-CM | POA: Diagnosis present

## 2019-09-05 DIAGNOSIS — M6281 Muscle weakness (generalized): Secondary | ICD-10-CM | POA: Diagnosis present

## 2019-09-05 DIAGNOSIS — R600 Localized edema: Secondary | ICD-10-CM | POA: Diagnosis present
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
## 2019-09-05 NOTE — Patient Instructions (Signed)
Access Code: [email or web address]  URL: [email or web address]  Date: 09/05/2019  Prepared by: Elsie Ra   Exercises  Seated Calf Stretch with Strap - 3 reps - 1 sets - 30 hold - 2x daily - 6x weekly  Seated Hamstring Stretch - 3 sets - 30 hold - 2x daily - 6x weekly  Seated Long Arc Quad - 10 reps - 2-3 sets - 2x daily - 6x weekly  Ankle Pumps in Elevation - 10 reps - 3 sets - 2x daily - 6x weekly  Seated Heel Toe Raises - 10 reps - 3 sets - 2x daily - 6x weekly  Seated Knee Flexion Slide - 10 reps - 3 sets - 2x daily - 6x weekly  Standing Weight Shift Side to Side in CAM boot - 10 reps - 3 sets - 2x daily - 6x weekly

## 2019-09-20 ENCOUNTER — Other Ambulatory Visit: Payer: Self-pay

## 2019-09-20 ENCOUNTER — Ambulatory Visit: Payer: No Typology Code available for payment source | Attending: Family Medicine | Admitting: Family Medicine

## 2019-09-20 ENCOUNTER — Encounter: Payer: Self-pay | Admitting: Family Medicine

## 2019-09-20 VITALS — BP 117/71 | HR 82 | Temp 98.5°F | Resp 18 | Ht 69.0 in

## 2019-09-20 DIAGNOSIS — L03116 Cellulitis of left lower limb: Secondary | ICD-10-CM | POA: Diagnosis not present

## 2019-09-20 DIAGNOSIS — M7989 Other specified soft tissue disorders: Secondary | ICD-10-CM

## 2019-09-20 DIAGNOSIS — L02416 Cutaneous abscess of left lower limb: Secondary | ICD-10-CM | POA: Diagnosis not present

## 2019-09-20 DIAGNOSIS — R21 Rash and other nonspecific skin eruption: Secondary | ICD-10-CM | POA: Diagnosis not present

## 2019-09-20 MED ORDER — CEPHALEXIN 500 MG PO CAPS: 500.0000 mg | ORAL_CAPSULE | Freq: Three times a day (TID) | ORAL | 0 refills | Status: DC

## 2019-09-20 MED ORDER — TRIAMCINOLONE ACETONIDE 0.1 % EX CREA: 1.0000 "application " | TOPICAL_CREAM | Freq: Two times a day (BID) | CUTANEOUS | 3 refills | Status: DC

## 2019-09-20 MED ORDER — FUROSEMIDE 20 MG PO TABS: 20.0000 mg | ORAL_TABLET | Freq: Every day | ORAL | Status: DC

## 2019-09-20 MED ORDER — POTASSIUM CHLORIDE ER 10 MEQ PO TBCR: 10.0000 meq | EXTENDED_RELEASE_TABLET | Freq: Every day | ORAL | 0 refills | Status: DC

## 2019-09-20 MED ORDER — FUROSEMIDE 20 MG PO TABS: 20.0000 mg | ORAL_TABLET | Freq: Every day | ORAL | 0 refills | Status: DC

## 2019-09-20 NOTE — Patient Instructions (Signed)
Celulitis, en adultos Cellulitis, Adult  La celulitis es una infeccin de la piel. La zona infectada por lo general est caliente, de color rojo, hinchada y duele. Ocurre con ms frecuencia en los brazos y en la parte inferior de las piernas. Es importante realizar un tratamiento para Personnel officer. Cules son las causas? Esta afeccin est causada por bacterias. Las bacterias ingresan a travs de una lesin cutnea, por ejemplo, un corte, una [place], [place], [place] llaga abierta o una grieta. Qu incrementa el riesgo? Es ms probable que Orthoptist en personas que:  Tienen debilitado el sistema de defensa del organismo (sistema inmunitario).  Tienen heridas abiertas, quemaduras, picaduras o rasguos en la piel.  Son [place] de 73 aos de edad.  Tienen un problema de azcar en la sangre (diabetes).  Tienen una enfermedad heptica de larga duracin (crnica) o enfermedad renal (cirrosis).  Tienen mucho sobrepeso (es obeso).  Tienen un problema de la piel, como: ? Urticaria que pica (eczema). ? Movimiento lento de Herbalist en las venas (estasis venosa). ? Acumulacin de lquido debajo de la piel (edema).  Han recibido tratamiento con rayos de alta energa (radiacin).  Consumen drogas por va intravenosa. Cules son los signos o los sntomas? Los sntomas de esta afeccin incluyen los siguientes:  Piel que est: ? Enrojecida. ? Veteada. ? Manchada. ? Hinchada. ? Adolorida o duele al tocarla. ? Calor.  Cristy Hilts.  Escalofros.  Ampollas. Cmo se diagnostica? Esta afeccin se diagnostica en funcin de lo siguiente:  Antecedentes mdicos.  Examen fsico.  Anlisis de sangre.  Estudios de diagnstico por imgenes. Cmo se trata? El tratamiento de esta afeccin puede incluir lo siguiente:  Medicamentos para tratar las infecciones o Set designer.  Cuidado en el hogar, como por ejemplo: ? Reposo. ? Colocacin de paos fros o  tibios (compresas) sobre la piel.  La hospitalizacin, si la afeccin es muy grave. Siga estas indicaciones en su casa: Medicamentos  [employer] de venta libre y los recetados solamente como se lo haya indicado el mdico.  Si le recetaron un antibitico, tmelo como se lo haya indicado el mdico. No deje de tomarlo aunque comience a sentirse mejor. Indicaciones generales   Beba suficiente lquido para mantener la orina de color amarillo plido.  No toque ni frote la zona infectada.  Cuando est sentado o acostado, levante (eleve) la zona infectada por encima del nivel del corazn.  Coloque paos fros o tibios en la zona como se lo haya indicado el mdico.  Concurra a todas las visitas de seguimiento como se lo haya indicado el mdico. Esto es importante. Comunquese con un mdico si:  Tiene fiebre.  No comienza a mejorar luego de 1 o 2 das de [place].  El hueso o la articulacin que se encuentran debajo de la zona infectada empiezan a dolerle despus de que la piel se cura.  La infeccin regresa. Esto puede ocurrir en la misma zona o en otra.  Tiene una protuberancia hinchado en la zona.  Aparecen nuevos sntomas.  Se siente enfermo y tiene molestias y dolores musculares. Solicite ayuda inmediatamente si:  Sus sntomas empeoran.  Se siente muy somnoliento.  Devuelve (vomita) o tiene deposiciones acuosas (diarrea).  Nota unas lneas rojas en la piel que salen de la zona.  La zona de color rojo se agranda.  La zona roja se vuelve oscura. Estos sntomas pueden representar un problema grave que constituye Engineer, maintenance (IT). No espere a ver si los sntomas desaparecen.  Solicite atencin mdica de inmediato. Comunquese con el servicio de emergencias de su localidad (911 en los [place]). No conduzca por sus propios medios [employer]. Resumen  La celulitis es una infeccin de la piel. La zona a menudo se pone caliente, roja, hinchada y  duele.  Esta afeccin se trata con medicamentos, reposo, y paos fros y calientes.  Tome todos los medicamentos solamente como se lo haya indicado el mdico.  Informe al mdico si los sntomas no mejoran despus de 1 o 2das de [place]. Esta informacin no tiene Marine scientist el consejo del mdico. Asegrese de hacerle al mdico cualquier pregunta que tenga. Document Released: 05/05/2010 Document Revised: 05/23/2018 Document Reviewed: 05/23/2018 Elsevier Patient Education  2020 [employer].

## 2019-09-20 NOTE — Progress Notes (Signed)
Patient verified DOB Patient has not taken medication today. Patient has not eaten today. Patient complains of pain in left ankle being swollen and knee on the outer portion being swollen. Swelling has been present since the surgery and apart of her calf that feels numb intermittently.

## 2019-09-20 NOTE — Progress Notes (Signed)
Virtual Visit via Telephone Note  I connected with Allison Gray on 09/20/19 at  9:30 AM EDT by telephone and verified that I am speaking with the correct person using two identifiers.   I discussed the limitations, risks, security and privacy concerns of performing an evaluation and management service by telephone and the availability of in person appointments. I also discussed with the patient that there may be a patient responsible charge related to this service. The patient expressed understanding and agreed to proceed.  Patient Location: Home Provider Location: [facility] Others participating in call: call initiated by [place], CMA   History of Present Illness:       23 year old female new to the practice who is status post work-related injury on 08/26/2019 after being hit in her left leg by a piece of metal.  She presented to the ED by EMS and x-rays were done of her left leg showing displaced fracture of the left tibia and fibula.  Patient was taken to the emergency department for IM nailing of the displaced tibia fracture.        She reports that she continues to have issues with swelling in her left lower leg.  She also has noticed some new abnormal appearing areas on her legs that are gray.  Patient states that she can feel the areas when she touches her leg.  She sometimes feels as if she has tingling in her leg due to the swelling.  She has seen her orthopedic doctor in follow-up since her surgery/hospital discharge.  The areas on her leg have started after her last orthopedic appointment.  Patient does try to keep her leg elevated throughout the day.  She is not having any significant pain in the left lower extremity/calf area or foot.  She is mostly having a discomfort that ranges from about a 3-4 on a 0-to-10 scale when she has increased swelling.            On review of systems, she denies any headaches or dizziness, no chest pain or palpitations, no shortness of breath or cough.   She has had no headaches or dizziness, no recent fever or chills.  No urinary frequency or dysuria.  No other muscle or joint pain.        Past Medical History:  Diagnosis Date  . Known health problems: none     Past Surgical History:  Procedure Laterality Date  . TIBIA IM NAIL INSERTION Left 08/26/2019   Procedure: INTRAMEDULLARY (IM) NAIL TIBIAL FRACTURE;  Surgeon: Toni Arthurs, MD;  Location: [facility];  Service: Orthopedics;  Laterality: Left;    Family History  Problem Relation Age of Onset  . Cancer Neg Hx   . Hypertension Neg Hx   . Heart disease Neg Hx     Social History   Tobacco Use  . Smoking status: Never Smoker  . Smokeless tobacco: Never Used  Substance Use Topics  . Alcohol use: Never    Frequency: Never  . Drug use: Never     No Known Allergies     Observations/Objective: BP 117/71 (BP Location: Right Arm, Patient Position: Sitting, Cuff Size: Large)   Pulse 82   Temp 98.5 F (36.9 C) (Oral)   Resp 18   Ht 5\' 9"  (1.753 m)   LMP 09/01/2019   SpO2 100%   BMI 26.58 kg/m  Nurses note and vital signs reviewed General-well-nourished well-developed overweight for height appearing female no acute distress who is sitting on chair  in exam room with crutches leaned against a nearby wall.  Patient initially wearing a cam boot and compressive wrapping to the left lower extremity which she removed Lungs-clear to auscultation bilaterally Cardiovascular-regular rate and rhythm; palpable left dorsalis pedis and posterior tibial pulse Abdomen-soft, nontender Extremities-patient with generalized edema from the lower left thigh downwards including pedal edema.  Edema is nonpitting.  No significant right lower extremity edema. Skin-patient with healing surgical scar above the right knee.  Patient with areas on the left lateral lower leg where skin is slightly hypertrophic and does have a hyperpigmented/gray appearance and skin is slightly thickened in this area.  Patient with  area of erythema that is about the size of a half dollar with increased warmth on the left distal medial shin. Psych-normal mood and judgment  Assessment and Plan: 1. Cellulitis and abscess of left lower extremity Patient with cellulitis of the left lower leg.  Prescription provided for Keflex 500 mg 3 times daily.  If swelling and increased warmth and area of redness are not improving within the next few days, please call the office for sooner follow-up.  Also if increased area of redness, increased pain or any other concerns, call this office or follow-up with orthopedics.  May be seen at urgent care/ED if these issues occur on the weekend/9 business hours - cephALEXin (KEFLEX) 500 MG capsule; Take 1 capsule (500 mg total) by mouth 3 (three) times daily. For skin infection  Dispense: 30 capsule; Refill: 0  2. Swelling of left lower extremity Prescription provided for Lasix and potassium chloride as needed for swelling.  Continue to keep lower extremity elevated a few times per day to help with swelling - potassium chloride (KLOR-CON) 10 MEQ tablet; Take 1 tablet (10 mEq total) by mouth daily. When taking furosemide  Dispense: 30 tablet; Refill: 0 - furosemide (LASIX) 20 MG tablet; Take 1 tablet (20 mg total) by mouth daily. As needed for leg swelling  Dispense: 30 tablet; Refill: 0  3. Rash Patient with areas of dry, hyperpigmented skin on the left upper lateral lower leg.  Prescription for triamcinolone cream and patient should keep the skin in this area well moisturized - triamcinolone cream (KENALOG) 0.1 %; Apply 1 application topically 2 (two) times daily. X 10 days then as needed  Dispense: 30 g; Refill: 3  4.  Language barrier Spanish-speaking interpreter with [employer] interpretation services was used for telephone portion of the visit and [employer] video interpretation service was used for in clinic visit to help with translation.  Follow Up Instructions:Return in about 2 weeks (around  10/04/2019) for cellulitis- sooner if needed.    I discussed the assessment and treatment plan with the patient. The patient was provided an opportunity to ask questions and all were answered. The patient agreed with the plan and demonstrated an understanding of the instructions.   The patient was advised to call back or seek an in-person evaluation if the symptoms worsen or if the condition fails to improve as anticipated.  I provided 12 minutes of non-face-to-face time during this encounter.  Additional 15 minutes of face-to-face time was spent with the patient in the office.   Antony Blackbird, MD

## 2019-10-17 ENCOUNTER — Other Ambulatory Visit: Payer: Self-pay

## 2019-10-17 ENCOUNTER — Encounter: Payer: Self-pay | Admitting: Family Medicine

## 2019-10-17 ENCOUNTER — Ambulatory Visit: Payer: Self-pay | Attending: Family Medicine | Admitting: Family Medicine

## 2019-10-17 VITALS — BP 106/68 | HR 69 | Temp 98.2°F | Resp 18 | Ht 68.0 in | Wt 249.0 lb

## 2019-10-17 DIAGNOSIS — S82202S Unspecified fracture of shaft of left tibia, sequela: Secondary | ICD-10-CM

## 2019-10-17 DIAGNOSIS — M7989 Other specified soft tissue disorders: Secondary | ICD-10-CM

## 2019-10-17 MED ORDER — FUROSEMIDE 20 MG PO TABS: 20.0000 mg | ORAL_TABLET | Freq: Every day | ORAL | 0 refills | Status: DC

## 2019-10-17 MED ORDER — POTASSIUM CHLORIDE ER 10 MEQ PO TBCR: 10.0000 meq | EXTENDED_RELEASE_TABLET | Freq: Every day | ORAL | 0 refills | Status: DC

## 2019-10-17 NOTE — Progress Notes (Signed)
Established Patient Office Visit  Subjective:  Patient ID: Allison Gray, female    DOB: March 20, 1996  Age: 23 y.o. MRN: [id number]  CC:  Chief Complaint  Patient presents with  . Follow-up    HPI Allison Gray, 23 year old female who is status post injury to the left tibia/fibula with displaced fractures status post hospitalization and surgery 08/26/2019 through 08/27/2019 who presents in follow-up of recent visit on 09/20/2019 at which time patient had increased pain and redness and swelling in the left lower leg and ankle.  Patient was treated with Keflex for cellulitis as well as provided with prescriptions for Lasix and potassium to help with swelling.  She does report improvement in swelling and redness at today's visit.  She continues to have chronic discomfort in her left lower leg and ankle.  Discomfort ranges from a 6-8 on a 0-to-10 scale and is worse with attempts at ambulation.  She also is concerned that she might have a blood clot in her leg as her leg continuously stays swollen.  She has not yet had follow-up with orthopedics but expects to have an upcoming appointment sometime this month.  She denies any issues with chest pain or palpitations, no shortness of breath or cough, no headaches or dizziness.  She does feel fatigued.  She has urinary frequency related to Lasix use but no issues with dysuria.  No fever or chills.  Past Medical History:  Diagnosis Date  . Known health problems: none     Past Surgical History:  Procedure Laterality Date  . TIBIA IM NAIL INSERTION Left 08/26/2019   Procedure: INTRAMEDULLARY (IM) NAIL TIBIAL FRACTURE;  Surgeon: Toni Arthurs, MD;  Location: [facility];  Service: Orthopedics;  Laterality: Left;    Family History  Problem Relation Age of Onset  . Cancer Neg Hx   . Hypertension Neg Hx   . Heart disease Neg Hx     Social History   Socioeconomic History  . Marital status: Single    Spouse name: Not on file  . Number of children: Not on file    . Years of education: Not on file  . Highest education level: Not on file  Occupational History  . Not on file  Social Needs  . Financial resource strain: Not on file  . Food insecurity    Worry: Not on file    Inability: Not on file  . Transportation needs    Medical: Not on file    Non-medical: Not on file  Tobacco Use  . Smoking status: Never Smoker  . Smokeless tobacco: Never Used  Substance and Sexual Activity  . Alcohol use: Never    Frequency: Never  . Drug use: Never  . Sexual activity: Yes  Lifestyle  . Physical activity    Days per week: Not on file    Minutes per session: Not on file  . Stress: Not on file  Relationships  . Social Musician on phone: Not on file    Gets together: Not on file    Attends religious service: Not on file    Active member of club or organization: Not on file    Attends meetings of clubs or organizations: Not on file    Relationship status: Not on file  . Intimate partner violence    Fear of current or ex partner: Not on file    Emotionally abused: Not on file    Physically abused: Not on file    Forced sexual  activity: Not on file  Other Topics Concern  . Not on file  Social History Narrative  . Not on file    Outpatient Medications Prior to Visit  Medication Sig Dispense Refill  . docusate sodium (COLACE) 100 MG capsule Take 1 capsule (100 mg total) by mouth 2 (two) times daily. While taking narcotic pain medicine. (Patient not taking: Reported on 09/20/2019) 30 capsule 0  . cephALEXin (KEFLEX) 500 MG capsule Take 1 capsule (500 mg total) by mouth 3 (three) times daily. For skin infection 30 capsule 0  . enoxaparin (LOVENOX) 40 MG/0.4ML injection Inject 0.4 mLs (40 mg total) into the skin daily. 6 mL 0  . furosemide (LASIX) 20 MG tablet Take 1 tablet (20 mg total) by mouth daily. As needed for leg swelling 30 tablet 0  . potassium chloride (KLOR-CON) 10 MEQ tablet Take 1 tablet (10 mEq total) by mouth daily. When  taking furosemide 30 tablet 0  . senna (SENOKOT) 8.6 MG TABS tablet Take 2 tablets (17.2 mg total) by mouth 2 (two) times daily. 30 tablet 0  . triamcinolone cream (KENALOG) 0.1 % Apply 1 application topically 2 (two) times daily. X 10 days then as needed 30 g 3   No facility-administered medications prior to visit.     No Known Allergies  ROS Review of Systems  Constitutional: Positive for fatigue. Negative for chills and fever.  HENT: Negative for sore throat and trouble swallowing.   Respiratory: Negative for cough and shortness of breath.   Cardiovascular: Positive for leg swelling. Negative for chest pain and palpitations.  Gastrointestinal: Negative for abdominal pain, blood in stool, constipation, diarrhea and nausea.  Endocrine: Negative for polydipsia, polyphagia and polyuria.  Genitourinary: Positive for frequency (With Lasix use). Negative for dysuria.  Musculoskeletal: Positive for arthralgias and gait problem.  Neurological: Negative for dizziness and headaches.  Hematological: Negative for adenopathy. Does not bruise/bleed easily.      Objective:    Physical Exam  Constitutional: She is oriented to person, place, and time. She appears well-developed and well-nourished.  Well-nourished well-developed overweight for height/obese young adult female in no acute distress wearing mask as per office COVID-19 protocol.  Patient is seated on exam chair.  Neck: No JVD present.  Cardiovascular: Normal rate, regular rhythm and intact distal pulses.  Pulmonary/Chest: Effort normal and breath sounds normal.  Abdominal: Soft. There is no abdominal tenderness. There is no rebound and no guarding.  Musculoskeletal:        General: Tenderness and edema present.     Cervical back: Normal range of motion and neck supple.     Comments: Patient with some generalized tenderness of the left lower extremity from below the knee and including the ankle.  Patient with very mild right,  nonpitting distal lower extremity edema.  Patient with edema from slightly above the knee on the left downward including pedal edema.  Patient with mild pitting edema from the top of the foot proximal to the anterior ankle to mid shin.  Healed postsurgical scarring left lower extremity  Lymphadenopathy:    She has no cervical adenopathy.  Neurological: She is alert and oriented to person, place, and time.  Skin: Skin is warm and dry.  No acute erythema or increased warmth of the left lower extremity.  Mild chronic mildly erythematous skin changes to the left lower extremity distally due to recurrent edema  Psychiatric: She has a normal mood and affect.  Nursing note and vitals reviewed.   BP 106/68 (  BP Location: Right Arm, Patient Position: Sitting, Cuff Size: Large)   Pulse 69   Temp 98.2 F (36.8 C) (Oral)   Resp 18   Ht 5\' 8"  (1.727 m)   Wt 249 lb (112.9 kg)   LMP 09/16/2019   SpO2 98%   BMI 37.86 kg/m  Wt Readings from Last 3 Encounters:  10/17/19 249 lb (112.9 kg)  08/26/19 180 lb (81.6 kg)     Health Maintenance Due  Topic Date Due  . HIV Screening  07/13/2013  . PAP-Cervical Cytology Screening  07/14/2019  . PAP SMEAR-Modifier  07/14/2019      No results found for: TSH Lab Results  Component Value Date   WBC 11.3 (H) 08/26/2019   HGB 12.9 08/26/2019   HCT 39.3 08/26/2019   MCV 89.7 08/26/2019   PLT 309 08/26/2019   Lab Results  Component Value Date   NA 136 08/26/2019   K 3.6 08/26/2019   CO2 23 08/26/2019   GLUCOSE 101 (H) 08/26/2019   BUN 12 08/26/2019   CREATININE 0.67 08/26/2019   CALCIUM 8.6 (L) 08/26/2019   ANIONGAP 9 08/26/2019   No results found for: CHOL No results found for: HDL No results found for: LDLCALC No results found for: TRIG No results found for: CHOLHDL No results found for: ONGE9BHGBA1C    Assessment & Plan:  1. Closed fracture of shaft of left tibia, unspecified fracture morphology, sequela 2. Swelling of left lower  extremity She has had some improvement in redness and swelling since her last visit however she continues to have swelling of the left lower leg which appears chronic in nature status post history of fracture of the shaft of the left tibia.  She will be scheduled for vascular ultrasound to rule out DVT due to the continued swelling.  She is provided with new prescription for furosemide and potassium to take daily to help with the swelling.  She is also encouraged to make sure that she keeps follow-up appointment with orthopedics. - VAS US LOWER EXTREMITY VENOUS (DVT); Future - furosemide (LASIX) 20 MG tablet; Take 1 tablet (20 mg total) by mouth daily. As needed for leg swelling  Dispense: 30 tablet; Refill: 0 - potassium chloride (KLOR-CON) 10 MEQ tablet; Take 1 tablet (10 mEq total) by mouth daily. When taking furosemide  Dispense: 30 tablet; Refill: 0  3.  Language barrier [employer] video interpretation system used at today's visit  An After Visit Summary was printed and given to the patient.  Follow-up: Return in about 3 weeks (around 11/07/2019) for leg pain and swelling; keep appt for ultrasound and Orthopedics.   Cain Saupeammie Lizzett Nobile, MD

## 2019-10-18 ENCOUNTER — Other Ambulatory Visit: Payer: Self-pay

## 2019-10-18 ENCOUNTER — Ambulatory Visit (HOSPITAL_COMMUNITY)
Admission: RE | Admit: 2019-10-18 | Discharge: 2019-10-18 | Disposition: A | Discharge status: Home / Self Care | Payer: Self-pay | Source: Ambulatory Visit | Attending: Orthopedic Surgery | Admitting: Orthopedic Surgery

## 2019-10-18 DIAGNOSIS — S82202S Unspecified fracture of shaft of left tibia, sequela: Secondary | ICD-10-CM | POA: Diagnosis present

## 2019-10-18 DIAGNOSIS — M7989 Other specified soft tissue disorders: Secondary | ICD-10-CM | POA: Insufficient documentation

## 2019-10-18 NOTE — Progress Notes (Signed)
LLE venous duplex       has been completed. Preliminary results can be found under CV proc through chart review. Tessa Seaberry, BS, RDMS, RVT    

## 2019-10-18 NOTE — Progress Notes (Signed)
If you are still here can you contact patient through interpreter line regarding negative [place] -no DVT/blood clot

## 2019-10-22 ENCOUNTER — Telehealth: Payer: Self-pay | Admitting: *Deleted

## 2019-10-22 NOTE — Telephone Encounter (Signed)
Patient verified DOB Patient is aware of no blood clot being noted in the venous doppler. No further questions. Patient is aware of needing to follow up with ortho and keep her appointment with PCP on 12/3

## 2019-10-22 NOTE — Telephone Encounter (Signed)
-----   Message from Antony Blackbird, MD sent at 10/19/2019  5:03 PM EST ----- No evidence of blood clot/DVT on LE venous doppler

## 2019-10-28 DIAGNOSIS — R269 Unspecified abnormalities of gait and mobility: Secondary | ICD-10-CM | POA: Insufficient documentation

## 2019-11-01 ENCOUNTER — Encounter: Payer: Self-pay | Admitting: Family Medicine

## 2019-11-01 ENCOUNTER — Ambulatory Visit: Payer: Self-pay | Attending: Family Medicine | Admitting: Family Medicine

## 2019-11-01 ENCOUNTER — Other Ambulatory Visit: Payer: Self-pay

## 2019-11-01 DIAGNOSIS — Z789 Other specified health status: Secondary | ICD-10-CM

## 2019-11-01 DIAGNOSIS — S82202S Unspecified fracture of shaft of left tibia, sequela: Secondary | ICD-10-CM

## 2019-11-01 DIAGNOSIS — G8929 Other chronic pain: Secondary | ICD-10-CM

## 2019-11-01 DIAGNOSIS — M7989 Other specified soft tissue disorders: Secondary | ICD-10-CM

## 2019-11-01 DIAGNOSIS — Z603 Acculturation difficulty: Secondary | ICD-10-CM

## 2019-11-01 DIAGNOSIS — Z758 Other problems related to medical facilities and other health care: Secondary | ICD-10-CM

## 2019-11-01 DIAGNOSIS — M25572 Pain in left ankle and joints of left foot: Secondary | ICD-10-CM

## 2019-11-01 MED ORDER — TRAMADOL HCL 50 MG PO TABS: 50.0000 mg | ORAL_TABLET | Freq: Three times a day (TID) | ORAL | 0 refills | Status: DC | PRN

## 2019-11-01 NOTE — Progress Notes (Signed)
Virtual Visit via Telephone Note  I connected with Allison Gray on 11/01/19 at  3:10 PM EST by telephone and verified that I am speaking with the correct person using two identifiers.   I discussed the limitations, risks, security and privacy concerns of performing an evaluation and management service by telephone and the availability of in person appointments. I also discussed with the patient that there may be a patient responsible charge related to this service. The patient expressed understanding and agreed to proceed.  Patient Location: Home Provider Location: Office  Others participating in call: call initiated by [place], [place] who obtained Spanish speaking Interpreter through [employer] to help with language barrier   History of Present Illness:      23 yo female who is status post repair of a closed  fracture of the tibia and fibula. She reports that her leg remains swollen but no increase in swelling. She still feels that the back of her leg is numb. She also has the complaint of pain in her ankle. Pain in on both the outside and inside of her ankle and her ankle hurts constantly whether or not she is at rest or if trying to walk. She has tried otc tylenol and ibuprofen without any relief of her pain. She has recently started physical therapy and has an appointment coming up with her Orthopedic doctor on Dec 14th but she has not contacted Orthopedics about her pain. Pain is sharp and is about an 8 on a 0-10 scale.    Past Medical History:  Diagnosis Date  . Known health problems: none     Past Surgical History:  Procedure Laterality Date  . TIBIA IM NAIL INSERTION Left 08/26/2019   Procedure: INTRAMEDULLARY (IM) NAIL TIBIAL FRACTURE;  Surgeon: Wylene Simmer, MD;  Location: [facility];  Service: Orthopedics;  Laterality: Left;    Family History  Problem Relation Age of Onset  . Cancer Neg Hx   . Hypertension Neg Hx   . Heart disease Neg Hx     Social  History   Tobacco Use  . Smoking status: Never Smoker  . Smokeless tobacco: Never Used  Substance Use Topics  . Alcohol use: Never    Frequency: Never  . Drug use: Never     No Known Allergies     Observations/Objective: No vital signs or physical exam conducted as visit was done via telephone  Assessment and Plan: 1. Chronic pain of left ankle Discussed with patient that short term supply of tramadol will be sent into the pharmacy to help with her pain and that she is to keep her follow-up appointment with Orthopedics. Pain could be musculoskeletal but neuropathic pain is also a possibility.  - traMADol (ULTRAM) 50 MG tablet; Take 1 tablet (50 mg total) by mouth every 8 (eight) hours as needed for up to 5 days.  Dispense: 15 tablet; Refill: 0  2. Closed fracture of shaft of left tibia, unspecified fracture morphology, sequela Keep follow-up appointment with Orthopedics on Dec 14th. Patient was made aware that this office does not prescribe long term opioid medications in general and that she will need to discuss her chronic ankle and leg pain with her Orthopedic doctor as well as the area of numbness on the back of her left leg. [place] results were reviewed with patient and she did have have any evidence of blood clots in the left lower extremity.   3. Swelling of left lower extremity Continue use of lasix  and elevation of the leg as needed and remember to take potassium whenever also taking lasix to help with swelling.   4. Language barrier Spanish speaking interpreter used to help with language barrier at today's visit  Follow Up Instructions: as needed    I discussed the assessment and treatment plan with the patient. The patient was provided an opportunity to ask questions and all were answered. The patient agreed with the plan and demonstrated an understanding of the instructions.   The patient was advised to call back or seek an in-person evaluation if the symptoms worsen or if  the condition fails to improve as anticipated.  I provided 16 minutes of non-face-to-face time during this encounter.   Cain Saupe, MD

## 2019-11-01 NOTE — Progress Notes (Signed)
Patient verified DOB Patient has eaten today. Patient has taken medication. Patient complains of pain in the ankle starting back on the 23rd

## 2019-11-02 ENCOUNTER — Other Ambulatory Visit: Payer: Self-pay | Admitting: Family Medicine

## 2019-11-02 DIAGNOSIS — M25572 Pain in left ankle and joints of left foot: Secondary | ICD-10-CM

## 2019-11-02 DIAGNOSIS — G8929 Other chronic pain: Secondary | ICD-10-CM

## 2019-11-02 MED ORDER — TRAMADOL HCL 50 MG PO TABS: 50.0000 mg | ORAL_TABLET | Freq: Three times a day (TID) | ORAL | 0 refills | Status: AC | PRN

## 2019-11-02 NOTE — Progress Notes (Signed)
Patient ID: Allison Gray, female   DOB: 04/18/1996, 23 y.o.   MRN: [id number]  Message received that patient's tramadol needs to be sent to [facility] as [facility] does not except worker's comp

## 2020-12-12 ENCOUNTER — Ambulatory Visit (INDEPENDENT_AMBULATORY_CARE_PROVIDER_SITE_OTHER): Payer: 59 | Admitting: Nurse Practitioner

## 2020-12-12 ENCOUNTER — Encounter: Payer: Self-pay | Admitting: Nurse Practitioner

## 2020-12-12 ENCOUNTER — Other Ambulatory Visit (HOSPITAL_COMMUNITY)
Admission: RE | Admit: 2020-12-12 | Discharge: 2020-12-12 | Disposition: A | Discharge status: Home / Self Care | Payer: Self-pay | Source: Ambulatory Visit | Attending: Nurse Practitioner | Admitting: Nurse Practitioner

## 2020-12-12 ENCOUNTER — Other Ambulatory Visit: Payer: Self-pay

## 2020-12-12 VITALS — BP 112/78 | HR 70 | Resp 16 | Ht 67.25 in | Wt 248.0 lb

## 2020-12-12 DIAGNOSIS — Z30011 Encounter for initial prescription of contraceptive pills: Secondary | ICD-10-CM

## 2020-12-12 DIAGNOSIS — Z01419 Encounter for gynecological examination (general) (routine) without abnormal findings: Secondary | ICD-10-CM | POA: Diagnosis not present

## 2020-12-12 DIAGNOSIS — N926 Irregular menstruation, unspecified: Secondary | ICD-10-CM

## 2020-12-12 DIAGNOSIS — B9689 Other specified bacterial agents as the cause of diseases classified elsewhere: Secondary | ICD-10-CM

## 2020-12-12 DIAGNOSIS — N760 Acute vaginitis: Secondary | ICD-10-CM

## 2020-12-12 LAB — PREGNANCY, URINE: Preg Test, Ur: NEGATIVE

## 2020-12-12 MED ORDER — METRONIDAZOLE 500 MG PO TABS: 500.0000 mg | ORAL_TABLET | Freq: Two times a day (BID) | ORAL | 0 refills | Status: DC

## 2020-12-12 MED ORDER — LEVONORGESTREL-ETHINYL ESTRAD 0.1-20 MG-MCG PO TABS: 1.0000 | ORAL_TABLET | Freq: Every day | ORAL | 12 refills | Status: DC

## 2020-12-12 NOTE — Patient Instructions (Signed)
Vaginosis bacteriana Bacterial Vaginosis  La vaginosis bacteriana es una infeccin que ocurre cuando cambia el equilibrio normal de las bacterias que se encuentran en la vagina. La causa de este cambio es la proliferacin excesiva de ciertas bacterias en la vagina. La vaginosis bacteriana es la infeccin vaginal ms frecuente MeadWestvaco de 15 a 44aos. Esta afeccin aumenta el riesgo de tener infecciones de transmisin sexual (ITS). El tratamiento puede ayudar a Psychiatrist. El tratamiento es muy importante para las mujeres embarazadas porque esta afeccin puede provocar que los bebs nazcan antes de tiempo (prematuramente) o con bajo peso. Cules son las causas? Esta afeccin se origina por un aumento de bacterias nocivas que, generalmente, estn presentes en cantidades pequeas en la vagina. No obstante, no se conoce el motivo exacto de la aparicin de esta afeccin. El contagio no se produce en baos, por ropas de cama, en piscinas ni por contacto con objetos. Qu incrementa el riesgo? Los siguientes factores pueden hacer que sea ms propenso a Armed forces training and education officer afeccin:  Tener una nueva pareja sexual o mltiples parejas sexuales, o tener relaciones sexuales sin proteccin.  Hacerse duchas vaginales.  Tener colocado un dispositivo intrauterino(DIU).  Fumar.  Consumir drogas y alcohol en exceso. Esto puede llevar a una conducta sexual ms riesgosa.  Tomar ciertos antibiticos.  Estar embarazada. Cules son los signos o sntomas? Algunas mujeres con esta afeccin no manifiestan ningn sntoma. Entre los sntomas, se pueden incluir los siguientes:  Secrecin vaginal de color gris o blanco. La secrecin puede ser acuosa o espumosa.  Secrecin vaginal con olor similar al [employer]; en especial, despus de Armed forces operational officer sexuales o durante la menstruacin.  Picazn en la vagina y alrededor de esta.  Ardor o dolor al [employer]. Cmo se diagnostica? Esta afeccin se  diagnostica en funcin de lo siguiente:  Sus antecedentes mdicos.  Examen fsico de la vagina.  Anlisis de [place] de lquido vaginal para detectar bacterias nocivas o clulas anormales. Cmo se trata? Esta afeccin se trata con antibiticos. Podran administrarse en forma de pastillas, de una crema vaginal o de un medicamento que se coloca dentro de la vagina(vulo vaginal). Si la afeccin se repite despus del tratamiento, podra indicarse una segunda tanda de antibiticos. Siga estas instrucciones en su casa: Medicamentos  Tome o aplquese los medicamentos de venta libre y los recetados solamente como se lo haya indicado el mdico.  Tome los antibiticos o aplqueselos como se lo haya indicado el mdico. No deje de usar el antibitico aunque comience a Sports administrator. Instrucciones generales  Si tiene una pareja sexual mujer, avsele que sufre una infeccin vaginal. Festus Holts debe concurrir a visitas de control con el mdico. Si tiene una pareja sexual hombre, l no necesita tratamiento.  Evite la actividad sexual hasta que haya finalizado el [place].  Beba suficiente lquido como para Theatre manager la orina de color amarillo plido.  Mantenga limpia la zona que rodea la vagina y Designer, television/film set. ? Lave la zona diariamente con agua tibia. ? Cuando vaya al bao, siempre higiencese desde adelante hacia atrs.  Si est amamantando, hable con su mdico acerca de Risk analyst.  Cumpla con todas las visitas de seguimiento. Esto es importante. Cmo se previene? Autocuidado  No se haga duchas vaginales.  Higiencese la parte exterior de la vagina solo con agua tibia.  Use ropa interior de algodn o con revestimiento de algodn.  No use pantalones ni pantis ajustados; en especial, durante el verano. Sexo seguro  Utilice  proteccin cuando Ryerson Incmantenga relaciones sexuales. Esto puede comprender lo siguiente: ? Utilizar condn. ? Utilizar barrera bucal. Se  trata de una fina capa de un material hecho de ltex o poliuretano que protege la boca durante el sexo oral.  Limite el nmero de parejas sexuales que tiene. Para prevenir la vaginosis bacteriana, es mejor Applied Materialsmantener relaciones sexuales solo con IT traineruna pareja(relacin mongama).  Es importante que usted y su pareja sexual se realicen estudios de deteccin de ITS. Drogas y alcohol  No consuma ningn producto que contenga nicotina o tabaco. Estos productos incluyen cigarrillos, tabaco para Theatre managermascar y aparatos de vapeo, como los Administrator, Civil Servicecigarrillos electrnicos. Si necesita ayuda para dejar de fumar, consulte al mdico.  No consuma drogas.  No beba alcohol si: ? El mdico le indica que no lo haga. ? Est embarazada, puede estar embarazada o est tratando de quedar embarazada.  Si bebe alcohol: ? Limite la cantidad que consume de 0 a 1 medida por da. ? Est atenta a la cantidad de alcohol que hay en las bebidas que toma. En los [place], una medida equivale a una botella de cerveza de 12oz (355ml), un vaso de vino de 5oz (148ml) o un vaso de una bebida alcohlica de alta graduacin de 1oz (44ml). Dnde buscar ms informacin  Centers for Disease Control and Prevention (Centros para el Control y la Prevencin de Event organisernfermedades): FootballExhibition.com.brwww.cdc.gov  American Sexual Health Association Designer, jewellery(ASHA) (Asociacin Estadounidense de la Salud Sexual): www.ashastd.org  U.S. Department of Health and CarMaxHuman Services, Office on [address]Women's Health (Departamento de Salud y [place] PassServicios Sociales de los Estados Unidos, [place]Oficina de Salud de la Mujer): http://hoffman.com/www.womenshealth.gov Comunquese con un mdico si:  Los sntomas no mejoran, ni siquiera despus del Boonetratamiento.  Tiene ms secrecin o siente dolor al ConocoPhillipsorinar.  Tiene fiebre o escalofros.  Siente dolor en el abdomen o la pelvis.  Siente dolor durante las The St. Paul Travelersrelaciones sexuales.  Tiene sangrado vaginal entre los periodos Becton, Dickinson and Companymenstruales. Resumen  La vaginosis bacteriana es una  infeccin vaginal que ocurre cuando cambia el equilibrio normal de las bacterias que se encuentran en la vagina. Es el resultado de un crecimiento excesivo de ciertas bacterias.  Esta afeccin aumenta el riesgo de tener infecciones de transmisin sexual (ITS). Recibir tratamiento puede ayudar a reducir First Data Corporationeste riesgo.  El tratamiento es muy importante para las mujeres embarazadas porque esta afeccin puede provocar que los bebs nazcan antes de tiempo (prematuramente) o con bajo peso.  Esta afeccin se trata con antibiticos. Podran administrarse en forma de pastillas, de una crema vaginal o de un medicamento que se coloca dentro de la vagina(vulo vaginal). Esta informacin no tiene Theme park managercomo fin reemplazar el consejo del mdico. Asegrese de hacerle al mdico cualquier pregunta que tenga. Document Revised: 06/18/2020 Document Reviewed: 06/18/2020 Elsevier Patient Education  2021 Elsevier Inc.   Sndrome del ovario poliqustico Polycystic Ovary Syndrome  El sndrome del ovario poliqustico (SOP) es un trastorno hormonal comn en mujeres en edad reproductiva. En la Harley-Davidsonmayora de las mujeres con SOP, pequeos sacos llenos de lquido (quistes) crecen Teachers Insurance and Annuity Associationen los ovarios. Esto puede ocasionar problemas con los perodos menstruales y dificultades para quedar embarazada y no perder Firefighterel embarazo. Si no se trata, esta afeccin puede acarrear graves problemas de salud, como diabetes y enfermedades cardacas. Cules son las causas? Se desconoce la causa de esta afeccin. Puede deberse a ciertos factores, como:  Ciclo menstrual irregular.  Niveles altos de ciertas hormonas.  Problemas con la hormona que ayuda a Psychologist, clinicalcontrolar el azcar en la sangre (insulina).  Ciertos  genes. Qu incrementa el riesgo? Es ms probable que desarrolle esta afeccin si:  Tiene antecedentes familiares de SOP o de diabetes tipo 2.  Tiene sobrepeso, come American Family Insurance y no [place]. Estos factores pueden causar  problemas con el control del azcar en la sangre, lo que puede contribuir al Cal[employer] Corporation o sntomas del SOP. Cules son los signos o sntomas? Los sntomas de esta afeccin incluyen:  Quistes ovricos y, en ocasiones, dolor plvico.  Perodos menstruales que no son regulares o son demasiado intensos.  Incapacidad de quedar embarazada o de no perder [employer] Corporation.  Aumento del crecimiento del vello en el rostro, el trax, el [address], la espalda, los muslos o los dedos de los pies.  Piel grasa o acn. El acn puede aparecer durante la [place] y es posible que no mejore con Pharmacist, community.  Sobrepeso u obesidad.  Manchas de piel gruesa marrn oscura o negra en el cuello, brazos, pechos o caderas. Cmo se diagnostica? Esta afeccin se diagnostica en funcin de lo siguiente:  Sus antecedentes mdicos.  Un examen fsico que incluye un examen plvico. El mdico puede buscar las zonas con aumento de crecimiento de vello sobre la piel.  Estudios, como, por ejemplo: ? Una ecografa para examinar los ovarios y la membrana del tero en busca de quistes. ? Anlisis de sangre para [employer] de azcar (glucosa), la hormona masculina (testosterona) y las hormonas femeninas (estrgeno y Education officer, museum). Cmo se trata? No hay cura para esta afeccin, pero el tratamiento puede ayudar a Chief Operating Officer los sntomas y Automotive engineer que se desarrollen otros problemas de [place]. El tratamiento vara segn los sntomas y si desea tener un beb o si necesita un mtodo anticonceptivo. El tratamiento puede incluir:  Education officer, environmental cambios nutricionales y de estilo de vida.  Recibir la hormona progesterona para iniciar un perodo menstrual.  Tomar pldoras anticonceptivas para Orthoptist.  Tomar medicamentos, por ejemplo: ? Medicamentos para estimular la ovulacin, si quiere quedar embarazada. ? Medicamentos para reducir Civil engineer, contracting de vello adicional.  Someterse a una ciruga cuando el  caso es grave. Esta puede consistir en hacer pequeos orificios en uno o ambos ovarios. Esto disminuye la cantidad de [employer] el cuerpo. Siga estas instrucciones en su casa:  Use los medicamentos de venta libre y los recetados solamente como se lo haya indicado el mdico.  Siga un plan de alimentacin saludable que incluya protenas magras, hidratos de carbono complejos, frutas y verduras frescas, productos lcteos con bajo contenido de [place], grasas saludables y [place].  Si tiene sobrepeso, baje de General Electric se lo haya indicado el mdico. El mdico puede determinar cunto peso tiene que perder y Restaurant manager, fast food a que adelgace de Wellsite geologist segura.  Cumpla con todas las visitas de seguimiento. Esto es importante. Comunquese con un mdico si:  Los sntomas no mejoran con los medicamentos.  Empeora o desarrolla nuevos sntomas. Resumen  El sndrome del ovario poliqustico (SOP) es un trastorno hormonal comn en mujeres en edad reproductiva.  Esto puede ocasionar problemas con los perodos menstruales y dificultades para quedar embarazada y no perder Firefighter.  Si no se trata, esta afeccin puede acarrear graves problemas de salud, como diabetes y enfermedades cardacas.  No hay cura para esta afeccin, pero el tratamiento puede ayudar a Chief Operating Officer los sntomas y Automotive engineer que se desarrollen otros problemas de [place]. Esta informacin no tiene Theme park manager el consejo del mdico. Asegrese de hacerle al mdico cualquier pregunta que tenga. Document Revised: 06/17/2020  Document Reviewed: 06/17/2020 Elsevier Patient Education  2021 Elsevier Inc.   Dieta para el sndrome del ovario poliqustico Diet for Polycystic Ovary Syndrome El sndrome del ovario poliqustico (SOP) es un trastorno hormonal frecuente que afecta el aparato reproductor de [place]. Esto puede ocasionar problemas con los perodos menstruales y dificultades para quedar embarazada y no perder Firefighter.  Modificar la dieta puede ayudar a las hormonas a Civil engineer, contracting, mejorar su salud y ayudarle a Futures trader. Seguir una dieta equilibrada puede ayudarle a bajar de peso y mejorar la forma en que el cuerpo [place] la hormona insulina para Pharmacologist en la [place]. Esto puede incluir:  Consumir protenas de bajo contenido de grasa ([place]), hidratos de carbono complejos, frutas y verduras frescas, productos lcteos con bajo contenido de [place], grasas saludables y [place].  Reducir las caloras.  Hacer actividad fsica con regularidad. Consejos para seguir este plan  Siga Neomia Dear dieta equilibrada para comidas y colaciones. [place], almuerce y cene, y agregue una o dos colaciones diarias.  Incluya protenas en cada comida y colacin.  Elija los Counselling psychologist de los productos elaborados con [place].  Consuma diferentes alimentos.  Haga actividad fsica habitualmente como se lo haya indicado el mdico. Intente realizar al menos 30 minutos de actividad fsica la mayor parte de los [address] de la [place].  Si tiene sobrepeso o es obesa: ? Preste atencin a la cantidad de Walt Disney ingiere. Reducir las caloras puede ayudarla a Publishing copy de [place]. ? Trabaje con el mdico o un nutricionista para saber cuntas caloras necesita cada da. Qu alimentos debo comer? Frutas Incluya una variedad de colores y tipos. Todas las frutas son tiles para el SOP. Verduras Incluya una variedad de colores y tipos. Todas las verduras son tiles para el SOP. Granos Cereales integrales, como salvado. Panes, galletitas, cereales y pastas integrales. Avena sin azcar. Trigo de [place], [place], quinua y arroz integral. Tortillas hechas de maz o salvado. Carnes y [address] protenas [address], como pescado, pollo, frijoles, huevos y tofu. Lcteos Productos lcteos con bajo contenido de [place], como [place], palitos de queso y Dentist. Bebidas Bebidas con bajo  contenido de [place] (the [place] of 60 deg S) o sin grasa, como [place], [place] de bajo [place], [place] sin azcar y pequeas cantidades de jugo 100% de frutas. Alios y condimentos [place]. Mostaza. Salsa barbacoa. Salsa de pepinillos. Mayonesa con bajo contenido de grasa o sin grasa. Grasas y aceites Aceite de oliva o de canola. Nueces y [place]. Es posible que los productos mencionados arriba no formen una lista completa de las bebidas o los alimentos recomendados. Comunquese con un nutricionista para conocer ms opciones.   Qu alimentos debo evitar? Alimentos ricos en caloras o grasas, especialmente grasas saturadas o trans. Comidas fritas. [place]. Los productos elaborados con harina blanca refinada, entre ellos, pan blanco, pasteles, arroz blanco y pastas. Es posible que los productos que se enumeran ms arriba no sean una lista completa de los alimentos y las bebidas que se Theatre stage manager. Consulte a un nutricionista para obtener ms informacin. Resumen  El SOP es un desequilibrio hormonal que afecta el sistema reproductor de la [place]. Esto puede ocasionar problemas con los perodos menstruales y dificultades para quedar embarazada y no perder Firefighter.  Puede ayudar a Sales executive SOP al realizar actividad fsica de [place] regular y comer una dieta saludable y variada compuesta de verduras, frutas, cereales integrales, protenas magras y productos lcteos de bajo contenido de [place].  Cambiar  lo que come Biomedical scientist en la que el organismo [place] la [place], ayudar a las hormonas a Nature conservation officer normales y Public house manager a Curator. Esta informacin no tiene Theme park manager el consejo del mdico. Asegrese de hacerle al mdico cualquier pregunta que tenga. Document Revised: 06/17/2020 Document Reviewed: 06/17/2020 Elsevier Patient Education  2021 Arvin[employer]itor.

## 2020-12-12 NOTE — Progress Notes (Signed)
25 y.o. [id number] Single Other or two or more races female here for annual exam.     Reports irregular menses When she was young had infrequent periods ages [age] through 38 Age 64 periods became more regular, basically every month but would skip at times Has been in [place] x 3 years and stated gained 60 lbs  In the past year, periods come about every 6 weeks Last period was about 8 weeks ago Periods usually last 4-5 days Sometimes cramps Is interested in birth control, would like pills, had an elevated B/P or two in past, not consistently Had sex yesterday, declines plan B    No LMP recorded. (Menstrual status: Irregular Periods).          Sexually active: Yes.    The current method of family planning is none.    Exercising: No.  exercise Smoker:  no  Health Maintenance: Pap:  unsure History of abnormal Pap:  no MMG:  none Colonoscopy:  none BMD:   none TDaP:  2020 Gardasil:   none Covid-19: pfizer Hep C testing: unsure Screening Labs: labs drawn today   reports that she has never smoked. She has never used smokeless tobacco. She reports that she does not drink alcohol and does not use drugs.  Past Medical History:  Diagnosis Date  . Known health problems: none     Past Surgical History:  Procedure Laterality Date  . TIBIA IM NAIL INSERTION Left 08/26/2019   Procedure: INTRAMEDULLARY (IM) NAIL TIBIAL FRACTURE;  Surgeon: Wylene Simmer, MD;  Location: [facility];  Service: Orthopedics;  Laterality: Left;    No current outpatient medications on file.   No current facility-administered medications for this visit.    Family History  Problem Relation Age of Onset  . Diabetes Maternal Grandmother   . Skin cancer Paternal Grandmother   . Thyroid disease Paternal Grandmother     Review of Systems  Constitutional: Negative.   HENT: Negative.   Eyes: Negative.   Respiratory: Negative.   Cardiovascular: Negative.   Gastrointestinal: Negative.   Endocrine: Negative.    Genitourinary:       Irregular cycle  Musculoskeletal: Negative.   Skin: Negative.   Allergic/Immunologic: Negative.   Neurological: Negative.   Hematological: Negative.   Psychiatric/Behavioral: Negative.     Exam:   Ht 5' 7.25" (1.708 m)   Wt 248 lb (112.5 kg)   BMI 38.55 kg/m   Height: 5' 7.25" (170.8 cm)  General appearance: alert, cooperative and appears stated age, no acute distress Head: Normocephalic, without obvious abnormality Neck: no adenopathy, thyroid normal to inspection and palpation Lungs: clear to auscultation bilaterally Breasts: normal appearance, no masses or tenderness, No axillary or supraclavicular adenopathy, Normal to palpation without dominant masses Heart: regular rate and rhythm Abdomen: soft, non-tender; no masses,  no organomegaly Extremities: extremities normal, no edema Skin: No rashes or lesions Lymph nodes: Cervical, supraclavicular, and axillary nodes normal. No abnormal inguinal nodes palpated Neurologic: Grossly normal   Pelvic: External genitalia:  no lesions              Urethra:  normal appearing urethra with no masses, tenderness or lesions              Bartholins and Skenes: normal                 Vagina: normal appearing vagina, appropriate for age, normal appearing discharge, no lesions              Cervix:  neg cervical motion tenderness, no visible lesions             Bimanual Exam:   Uterus:  normal size, contour, position, consistency, mobility, non-tender              Adnexa: no mass, fullness, tenderness                 Joy, CMA Chaperone was present for exam.  A:  Well woman exam with routine gynecological exam - Plan: Cytology - PAP( )  Irregular menstrual cycle - Plan: Pregnancy, urine, TSH, CBC, Comp Met (CMET), Prolactin  BV (bacterial vaginosis) - Plan: metroNIDAZOLE (FLAGYL) 500 MG tablet  OCP (oral contraceptive pills) initiation - Plan: levonorgestrel-ethinyl estradiol (ALESSE) 0.1-20 MG-MCG  tablet  Discussed probable PCOS, encouraged diet, exercise, weight loss Spanish Speaking, Interpreter: number [id number]  P:   Pap :yes/GC/CT  Labs: as above  Medications: Alesse (enocuraged to start today, use back up x 7 days, take pregnancy test if does not get period during placebo week), metronidazole  F/u 3 months to discuss periods, check B/P (history of labile B/P) on OCP

## 2020-12-13 LAB — COMPREHENSIVE METABOLIC PANEL
AG Ratio: 1 (calc) (ref 1.0–2.5)
ALT: 12 U/L (ref 6–29)
AST: 15 U/L (ref 10–30)
Albumin: 4.1 g/dL (ref 3.6–5.1)
Alkaline phosphatase (APISO): 110 U/L (ref 31–125)
BUN: 13 mg/dL (ref 7–25)
CO2: 26 mmol/L (ref 20–32)
Calcium: 9.6 mg/dL (ref 8.6–10.2)
Chloride: 101 mmol/L (ref 98–110)
Creat: 0.73 mg/dL (ref 0.50–1.10)
Globulin: 4 g/dL (calc) — ABNORMAL HIGH (ref 1.9–3.7)
Glucose, Bld: 73 mg/dL (ref 65–99)
Potassium: 3.9 mmol/L (ref 3.5–5.3)
Sodium: 137 mmol/L (ref 135–146)
Total Bilirubin: 0.5 mg/dL (ref 0.2–1.2)
Total Protein: 8.1 g/dL (ref 6.1–8.1)

## 2020-12-13 LAB — CBC
HCT: 38.3 % (ref 35.0–45.0)
Hemoglobin: 13.4 g/dL (ref 11.7–15.5)
MCH: 30.9 pg (ref 27.0–33.0)
MCHC: 35 g/dL (ref 32.0–36.0)
MCV: 88.5 fL (ref 80.0–100.0)
MPV: 10.1 fL (ref 7.5–12.5)
Platelets: 379 10*3/uL (ref 140–400)
RBC: 4.33 10*6/uL (ref 3.80–5.10)
RDW: 12.7 % (ref 11.0–15.0)
WBC: 10 10*3/uL (ref 3.8–10.8)

## 2020-12-13 LAB — PROLACTIN: Prolactin: 6.6 ng/mL

## 2020-12-13 LAB — TSH: TSH: 1.37 mIU/L

## 2020-12-13 IMAGING — RF DG C-ARM 1-60 MIN
1 series · 6 of 6 positions shown · non-contrast
Comparison: Earlier today

CLINICAL DATA: IM nail left tibial fracture.

EXAM:
LEFT TIBIA AND FIBULA - 2 VIEW; DG C-ARM 1-60 MIN

[Series 1: run · 6 of 6 slices shown]
[im 1/6]
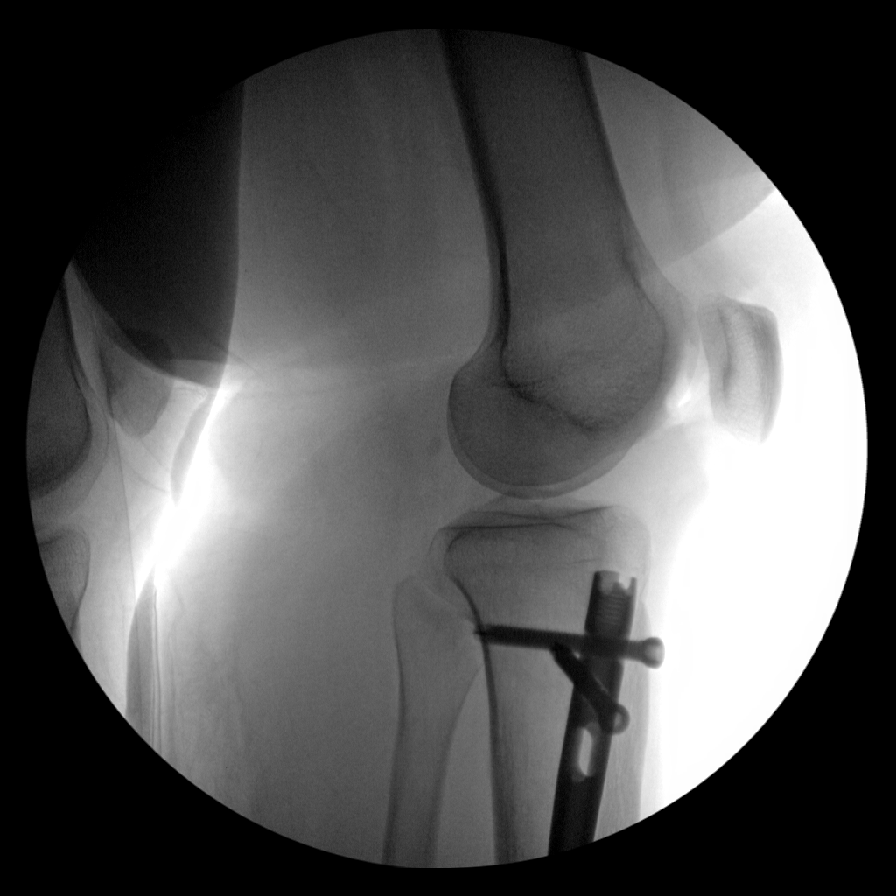
[im 2/6]
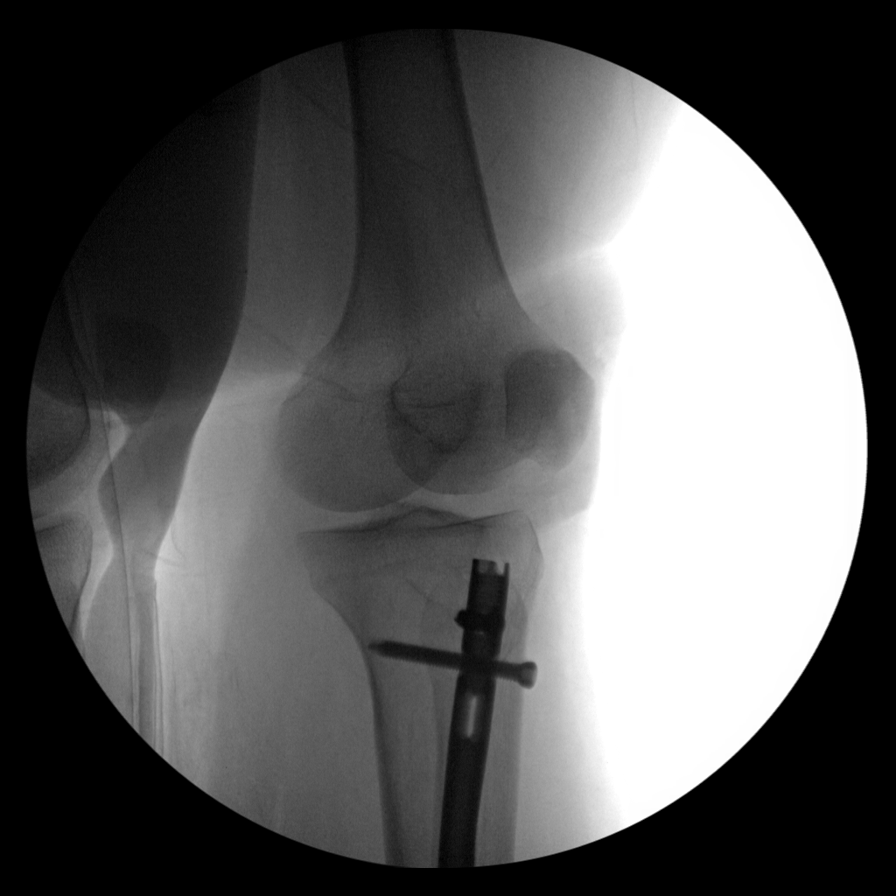
[im 3/6]
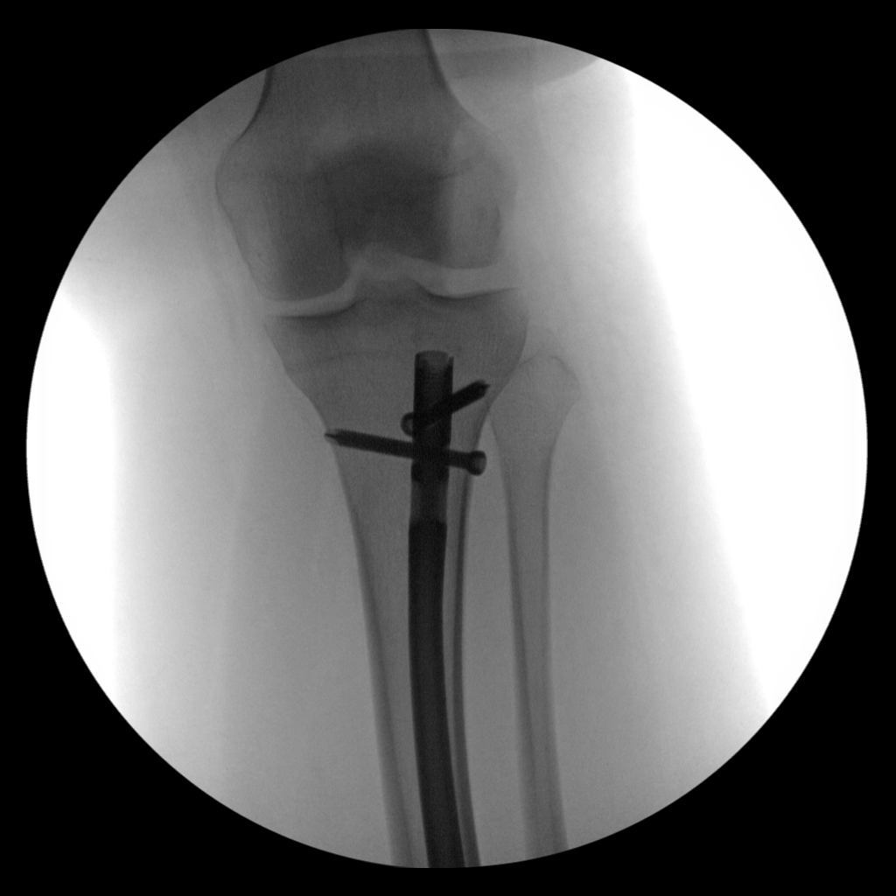
[im 4/6]
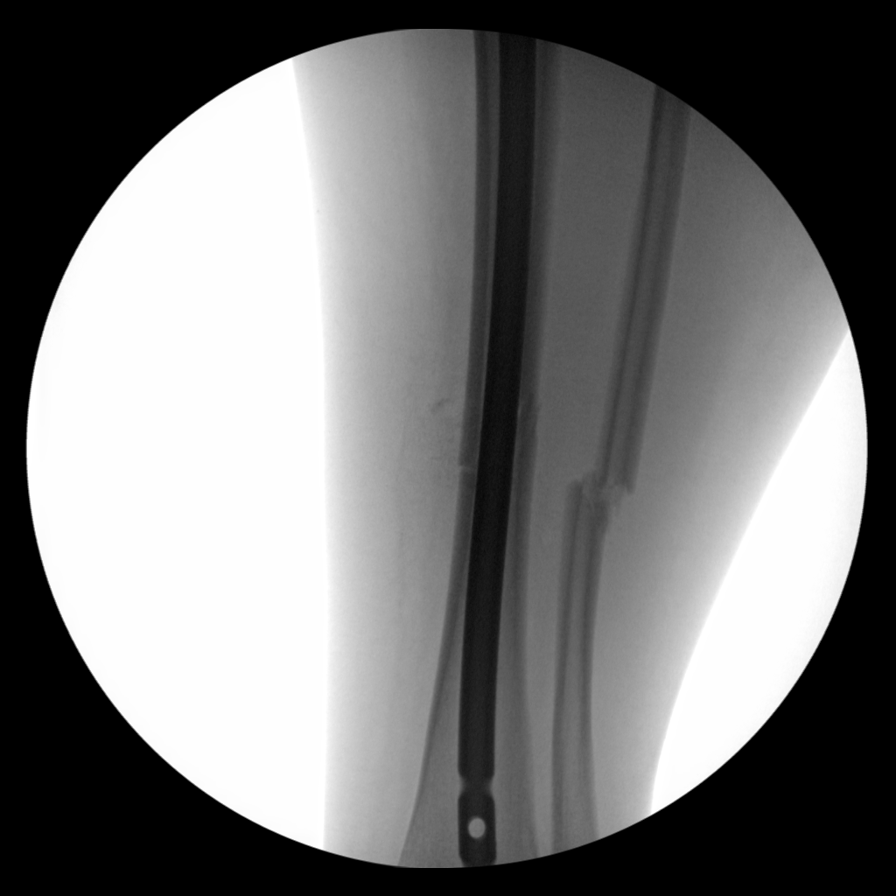
[im 5/6]
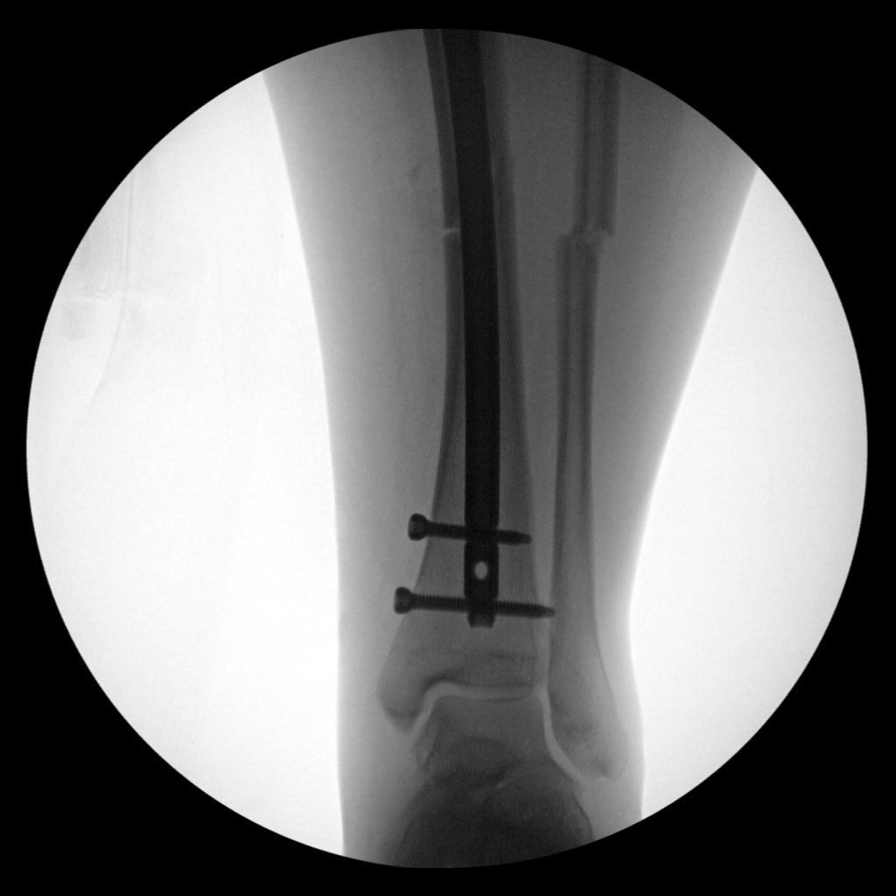
[im 6/6]
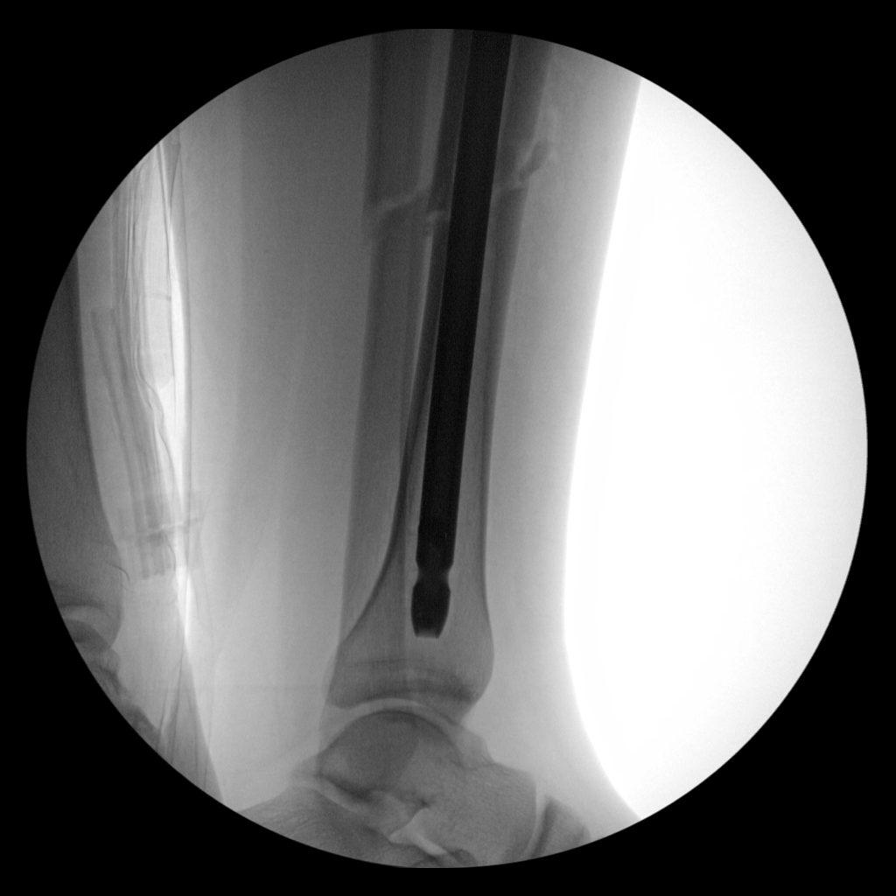

[6 of 6 positions shown; findings below may reference images not displayed]

FINDINGS: Intraoperative images from portable C-arm radiography show open
reduction and internal fixation of the distal diaphyseal fracture of
the left tibia. Hardware components and fracture fragments are in
anatomic alignment. Displaced tibial fracture is again noted.
IMPRESSION: 1. Status post ORIF left tibial fracture with IM nail and screw
device.

## 2020-12-16 LAB — CYTOLOGY - PAP
Chlamydia: NEGATIVE
Comment: NEGATIVE
Comment: NORMAL
Diagnosis: NEGATIVE
Neisseria Gonorrhea: NEGATIVE

## 2020-12-18 ENCOUNTER — Telehealth: Payer: Self-pay | Admitting: *Deleted

## 2020-12-18 NOTE — Telephone Encounter (Signed)
Per Clarita Crane results note on 12/12/20 "  Pap smear normal Gonorrhea and Chlamydia test negative Thyroid test normal Normal Whit blood cell count, not anemic.  Will route to [place] to relay in [place]

## 2020-12-19 ENCOUNTER — Encounter (HOSPITAL_COMMUNITY): Payer: Self-pay | Admitting: Emergency Medicine

## 2020-12-19 ENCOUNTER — Emergency Department (HOSPITAL_COMMUNITY)
Admission: EM | Admit: 2020-12-19 | Discharge: 2020-12-19 | Disposition: A | Discharge status: Home / Self Care | Payer: 59 | Attending: Emergency Medicine | Admitting: Emergency Medicine

## 2020-12-19 ENCOUNTER — Other Ambulatory Visit: Payer: Self-pay

## 2020-12-19 ENCOUNTER — Emergency Department (HOSPITAL_COMMUNITY): Payer: 59

## 2020-12-19 DIAGNOSIS — Z20822 Contact with and (suspected) exposure to covid-19: Secondary | ICD-10-CM | POA: Insufficient documentation

## 2020-12-19 DIAGNOSIS — R070 Pain in throat: Secondary | ICD-10-CM | POA: Diagnosis not present

## 2020-12-19 DIAGNOSIS — R059 Cough, unspecified: Secondary | ICD-10-CM | POA: Diagnosis not present

## 2020-12-19 DIAGNOSIS — R0989 Other specified symptoms and signs involving the circulatory and respiratory systems: Secondary | ICD-10-CM | POA: Insufficient documentation

## 2020-12-19 DIAGNOSIS — R0602 Shortness of breath: Secondary | ICD-10-CM | POA: Insufficient documentation

## 2020-12-19 DIAGNOSIS — R062 Wheezing: Secondary | ICD-10-CM | POA: Insufficient documentation

## 2020-12-19 DIAGNOSIS — J40 Bronchitis, not specified as acute or chronic: Secondary | ICD-10-CM

## 2020-12-19 LAB — SARS CORONAVIRUS 2 BY RT PCR (HOSPITAL ORDER, PERFORMED IN ~~LOC~~ HOSPITAL LAB): SARS Coronavirus 2: NEGATIVE

## 2020-12-19 MED ORDER — ALBUTEROL SULFATE HFA 108 (90 BASE) MCG/ACT IN AERS: 1.0000 | INHALATION_SPRAY | Freq: Four times a day (QID) | RESPIRATORY_TRACT | 0 refills | Status: DC | PRN

## 2020-12-19 MED ORDER — PREDNISONE 10 MG (21) PO TBPK: ORAL_TABLET | Freq: Every day | ORAL | 0 refills | Status: DC

## 2020-12-19 MED ORDER — ALBUTEROL SULFATE HFA 108 (90 BASE) MCG/ACT IN AERS
2.0000 | INHALATION_SPRAY | Freq: Once | RESPIRATORY_TRACT | Status: AC
  Administered 2020-12-19: 2 via RESPIRATORY_TRACT
  Filled 2020-12-19: qty 6.7

## 2020-12-19 MED ORDER — PREDNISONE 20 MG PO TABS
60.0000 mg | ORAL_TABLET | Freq: Once | ORAL | Status: AC
  Administered 2020-12-19: 60 mg via ORAL
  Filled 2020-12-19: qty 3

## 2020-12-19 NOTE — ED Triage Notes (Signed)
Patient complains of back pain and shortness of breath x4 days. SpO2 on room air 97%

## 2020-12-19 NOTE — ED Provider Notes (Signed)
[facility] EMERGENCY DEPARTMENT Provider Note   CSN: [id number] Arrival date & time: 12/19/20  1336     History Chief Complaint  Patient presents with   Shortness of Breath    Margarette Vannatter is a 25 y.o. female.  25 year old female presents with several days of wheezing cough and congestion.  Patient states that she has had 2 doses of the COVID-vaccine.  Denies any vomiting or diarrhea.  Cough is been nonproductive.  Denies any fever or chills.  Mild sore throat but no trouble swallowing.  Slight ear congestion.  Using over-the-counter medications without any relief.        Past Medical History:  Diagnosis Date   Known health problems: none     Patient Active Problem List   Diagnosis Date Noted   Closed fracture of shaft of tibia and fibula, left, initial encounter 08/26/2019    Past Surgical History:  Procedure Laterality Date   TIBIA IM NAIL INSERTION Left 08/26/2019   Procedure: INTRAMEDULLARY (IM) NAIL TIBIAL FRACTURE;  Surgeon: Toni Arthurs, MD;  Location: [facility] OR;  Service: Orthopedics;  Laterality: Left;     OB History    Gravida  0   Para  0   Term  0   Preterm  0   AB  0   Living  0     SAB  0   IAB  0   Ectopic  0   Multiple  0   Live Births  0           Family History  Problem Relation Age of Onset   Diabetes Maternal Grandmother    Skin cancer Paternal Grandmother    Thyroid disease Paternal Grandmother     Social History   Tobacco Use   Smoking status: Never Smoker   Smokeless tobacco: Never Used  Vaping Use   Vaping Use: Never used  Substance Use Topics   Alcohol use: Never   Drug use: Never    Home Medications Prior to Admission medications   Medication Sig Start Date End Date Taking? Authorizing Provider  levonorgestrel-ethinyl estradiol (ALESSE) 0.1-20 MG-MCG tablet Take 1 tablet by mouth daily. 12/12/20   Clarita Crane, NP  metroNIDAZOLE (FLAGYL) 500 MG tablet Take 1 tablet (500 mg  total) by mouth 2 (two) times daily. 12/12/20   Clarita Crane, NP    Allergies    Patient has no known allergies.  Review of Systems   Review of Systems  All other systems reviewed and are negative.   Physical Exam Updated Vital Signs BP 130/82    Pulse 81    Temp 98.3 F (36.8 C) (Oral)    Resp 16    Ht 1.702 m (5\' 7" )    Wt 113.4 kg    SpO2 98%    BMI 39.16 kg/m   Physical Exam Vitals and nursing note reviewed.  Constitutional:      General: She is not in acute distress.    Appearance: Normal appearance. She is well-developed and well-nourished. She is not toxic-appearing.  HENT:     Head: Normocephalic and atraumatic.  Eyes:     General: Lids are normal.     Extraocular Movements: EOM normal.     Conjunctiva/sclera: Conjunctivae normal.     Pupils: Pupils are equal, round, and reactive to light.  Neck:     Thyroid: No thyroid mass.     Trachea: No tracheal deviation.  Cardiovascular:     Rate and Rhythm: Normal rate  and regular rhythm.     Heart sounds: Normal heart sounds. No murmur heard. No gallop.   Pulmonary:     Effort: Pulmonary effort is normal. Prolonged expiration present. No respiratory distress.     Breath sounds: No stridor. Examination of the right-upper field reveals decreased breath sounds and wheezing. Examination of the left-upper field reveals decreased breath sounds and wheezing. Decreased breath sounds and wheezing present. No rhonchi or rales.  Abdominal:     General: Bowel sounds are normal. There is no distension.     Palpations: Abdomen is soft.     Tenderness: There is no abdominal tenderness. There is no CVA tenderness or rebound.  Musculoskeletal:        General: No tenderness or edema. Normal range of motion.     Cervical back: Normal range of motion and neck supple.  Skin:    General: Skin is warm and dry.     Findings: No abrasion or rash.  Neurological:     Mental Status: She is alert and oriented to person, place, and time.     GCS:  GCS eye subscore is 4. GCS verbal subscore is 5. GCS motor subscore is 6.     Cranial Nerves: No cranial nerve deficit.     Sensory: No sensory deficit.     Deep Tendon Reflexes: Strength normal.  Psychiatric:        Mood and Affect: Mood and affect normal.        Speech: Speech normal.        Behavior: Behavior normal.     ED Results / Procedures / Treatments   Labs (all labs ordered are listed, but only abnormal results are displayed) Labs Reviewed  SARS CORONAVIRUS 2 BY RT PCR (HOSPITAL ORDER, PERFORMED IN [facility])    EKG None  Radiology DG Chest Portable 1 View  Result Date: 12/19/2020 CLINICAL DATA:  Shortness of breath EXAM: PORTABLE CHEST 1 VIEW COMPARISON:  08/26/2019 FINDINGS: The heart size and mediastinal contours are within normal limits. Both lungs are clear. The visualized skeletal structures are unremarkable. IMPRESSION: No acute cardiopulmonary process. Electronically Signed   By: Acquanetta Belling M.D.   On: 12/19/2020 14:31    Procedures Procedures (including critical care time)  Medications Ordered in ED Medications - No data to display  ED Course  I have reviewed the triage vital signs and the nursing notes.  Pertinent labs & imaging results that were available during my care of the patient were reviewed by me and considered in my medical decision making (see chart for details).    MDM Rules/Calculators/A&P                          Patient's rapid COVID test negative.  Chest x-ray without acute findings.  Does have evidence of wheezing here.  Suspect bronchitis.  Will give 2 puffs albuterol here along with inhaler to go home with.  Also placed on prednisone Final Clinical Impression(s) / ED Diagnoses Final diagnoses:  None    Rx / DC Orders ED Discharge Orders    None       Lorre Nick, MD 12/19/20 1550

## 2021-03-06 NOTE — Progress Notes (Deleted)
GYNECOLOGY  VISIT  CC:   ***  HPI: 25 y.o. G0P0000 Single Other or two or more races female here for birth control follow up.     GYNECOLOGIC HISTORY: No LMP recorded. (Menstrual status: Irregular Periods). Contraception: ocp Menopausal hormone therapy: none  Patient Active Problem List   Diagnosis Date Noted  . Closed fracture of shaft of tibia and fibula, left, initial encounter 08/26/2019    Past Medical History:  Diagnosis Date  . Known health problems: none     Past Surgical History:  Procedure Laterality Date  . TIBIA IM NAIL INSERTION Left 08/26/2019   Procedure: INTRAMEDULLARY (IM) NAIL TIBIAL FRACTURE;  Surgeon: Toni Arthurs, MD;  Location: [facility] OR;  Service: Orthopedics;  Laterality: Left;    MEDS:   Current Outpatient Medications on File Prior to Visit  Medication Sig Dispense Refill  . albuterol (VENTOLIN HFA) 108 (90 Base) MCG/ACT inhaler Inhale 1-2 puffs into the lungs every 6 (six) hours as needed for wheezing or shortness of breath. 1 each 0  . levonorgestrel-ethinyl estradiol (ALESSE) 0.1-20 MG-MCG tablet Take 1 tablet by mouth daily. 28 tablet 12  . metroNIDAZOLE (FLAGYL) 500 MG tablet Take 1 tablet (500 mg total) by mouth 2 (two) times daily. 14 tablet 0  . predniSONE (STERAPRED UNI-PAK 21 TAB) 10 MG (21) TBPK tablet Take by mouth daily. Take 6 tabs by mouth daily  for 2 days, then 5 tabs for 2 days, then 4 tabs for 2 days, then 3 tabs for 2 days, 2 tabs for 2 days, then 1 tab by mouth daily for 2 days 42 tablet 0   No current facility-administered medications on file prior to visit.    ALLERGIES: Patient has no known allergies.  Family History  Problem Relation Age of Onset  . Diabetes Maternal Grandmother   . Skin cancer Paternal Grandmother   . Thyroid disease Paternal Grandmother      Review of Systems  PHYSICAL EXAMINATION:    There were no vitals taken for this visit.    General appearance: alert, cooperative, no acute distress  CV:   {Exam; heart brief:31539} Lungs:  {pe lungs ob:314451::"clear to auscultation, no wheezes, rales or rhonchi, symmetric air entry"} Breasts: {Exam; breast:13139::"normal appearance, no masses or tenderness"} Abdomen: soft, non-tender; no masses,  no organomegaly Lymph:  no inguinal LAD noted  Pelvic: External genitalia:  no lesions              Urethra:  normal appearing urethra with no masses, tenderness or lesions              Bartholins and Skenes: normal                 Vagina: normal appearing vagina               Cervix: {CHL AMB PHY EX CERVIX NORM DEFAULT:[phone]::"no lesions"}              Bimanual Exam:  Uterus:  {CHL AMB PHY EX UTERUS NORM DEFAULT:[phone]::"normal size, contour, position, consistency, mobility, non-tender"}              Adnexa: {CHL AMB PHY EX ADNEXA NO MASS [phone]::"no mass, fullness, tenderness"}               Chaperone, ***, CMA, was present for exam.  Assessment: ***  Plan: ***   {NUMBERS; -10-45 JOINT ROM:10287} minutes of total time was spent for this patient encounter, including preparation, face-to-face counseling with the patient and coordination  of care, and documentation of the encounter.

## 2021-03-10 ENCOUNTER — Encounter: Payer: Self-pay | Admitting: Nurse Practitioner

## 2021-03-10 ENCOUNTER — Other Ambulatory Visit: Payer: Self-pay

## 2021-03-10 ENCOUNTER — Ambulatory Visit: Payer: 59 | Admitting: Nurse Practitioner

## 2021-03-10 VITALS — BP 106/70 | HR 78 | Resp 16 | Wt 255.0 lb

## 2021-03-10 DIAGNOSIS — Z3041 Encounter for surveillance of contraceptive pills: Secondary | ICD-10-CM | POA: Diagnosis not present

## 2021-03-10 NOTE — Progress Notes (Signed)
GYNECOLOGY  VISIT  CC:  Birth control follow up  HPI: 25 y.o. G0P0000 Single Other or two or more races female here for birth control follow up. Hx labial B/P's. States periods are regular now, no side effects of birth control pill. Desires to continue.   Does not want to talk about weight today. All labs were normal  Emotionally feeling good.  Had Covid 11/28/2020, had significant bronchitis in January, still has some difficulty with congestion. Has an inhaler and it helps.  GYNECOLOGIC HISTORY: Patient's last menstrual period was 02/17/2021 (exact date). Contraception: ocp Menopausal hormone therapy: none  Patient Active Problem List   Diagnosis Date Noted  . Closed fracture of shaft of tibia and fibula, left, initial encounter 08/26/2019    Past Medical History:  Diagnosis Date  . Known health problems: none     Past Surgical History:  Procedure Laterality Date  . TIBIA IM NAIL INSERTION Left 08/26/2019   Procedure: INTRAMEDULLARY (IM) NAIL TIBIAL FRACTURE;  Surgeon: Toni Arthurs, MD;  Location: [facility] OR;  Service: Orthopedics;  Laterality: Left;    MEDS:   Current Outpatient Medications on File Prior to Visit  Medication Sig Dispense Refill  . albuterol (VENTOLIN HFA) 108 (90 Base) MCG/ACT inhaler Inhale 1-2 puffs into the lungs every 6 (six) hours as needed for wheezing or shortness of breath. 1 each 0  . levonorgestrel-ethinyl estradiol (ALESSE) 0.1-20 MG-MCG tablet Take 1 tablet by mouth daily. 28 tablet 12   No current facility-administered medications on file prior to visit.    ALLERGIES: Patient has no known allergies.  Family History  Problem Relation Age of Onset  . Diabetes Maternal Grandmother   . Skin cancer Paternal Grandmother   . Thyroid disease Paternal Grandmother      Review of Systems  Constitutional: Negative.   HENT: Negative.   Eyes: Negative.   Respiratory: Negative.   Cardiovascular: Negative.   Gastrointestinal: Negative.    Endocrine: Negative.   Genitourinary: Negative.   Musculoskeletal: Negative.   Skin: Negative.   Allergic/Immunologic: Negative.   Neurological: Negative.   Hematological: Negative.   Psychiatric/Behavioral: Negative.     PHYSICAL EXAMINATION:    BP 106/70   Pulse 78   Resp 16   Wt 255 lb (115.7 kg)   LMP 02/17/2021 (Exact Date)   BMI 39.94 kg/m     General appearance: alert, cooperative, no acute distress  CV:  Regular rate and rhythm Lungs:  wheezing noted bilaterally              Assessment/Plan: Surveillance of previously prescribed contraceptive pill - continue Alesse Normal B/P    15 minutes of total time was spent for this patient encounter, including preparation, face-to-face counseling with the patient and coordination of care, and documentation of the encounter.

## 2021-03-10 NOTE — Patient Instructions (Signed)
Informacin sobre los anticonceptivos orales Oral Contraception Information Los anticonceptivos orales (ACO) son medicamentos que se toman por va oral para Catering manager. Funcionan de las siguientes maneras:  Evitando que los ovarios liberen vulos.  Engrosando la mucosidad de la parte inferior del tero (cuello uterino). Esto evita que los espermatozoides entren en el tero.  Adelgazando el revestimiento del tero (endometrio). Esto evita que el vulo fecundado se implante en el endometrio. Los ACO son muy efectivos cuando se toman exactamente como se prescriben. Sin embargo, no evitan las infecciones de transmisin sexual (ITS). El uso de condones junto con los ACO ayuda a Radio producer ese tipo de enfermedades. Qu sucede antes de comenzar a tomar ACO? Antes de comenzar a tomar ACO:  Air cabin crew un examen fsico, anlisis de sangre y prueba de Papanicolaou.  El mdico se asegurar de que usted sea [place] buena candidata para usar anticonceptivos orales. Los ACO no son Neomia Dear buena opcin para ciertas mujeres, como: ? Mujeres que fuman y tienen ms de 35 aos. ? Mujeres que tienen o han tenido algunas afecciones, por ejemplo:  Antecedentes de presin arterial alta.  Trombosis venosa profunda.  Embolia pulmonar.  Accidente cerebrovascular.  Enfermedad cardiovascular.  Enfermedad vascular perifrica. Consulte al [employer] acerca de los posibles efectos secundarios de los ACO que podra recetarle. Tenga en cuenta que puede llevar de 2 a que el organismo se adapte a los cambios en los niveles hormonales. Tipos de anticonceptivos orales Las pldoras anticonceptivas contienen las hormonas estrgeno y progestina (progesterona sinttica) o progestina sola. La pldora combinada Este tipo de pldora contiene estrgeno y Theatre stage manager.  Las pldoras anticonceptivas convencionales vienen en envases de 21 o 28pldoras. ? Algunos envases con pldoras para [address] contienen estrgeno y  [employer] primeros 21 a 24das. Los comprimidos sin hormonas, llamados placebos, se toman durante los ltimos 4 a 7das. Debera tener el sangrado menstrual durante el tiempo en que toma los placebos. ? Con los envases de 21 comprimidos, no se toman pldoras durante 7das. El sangrado menstrual se produce [employer]. (Algunas personas prefieren tomar Janyth Pupa durante 28das para ayudar a establecer una rutina).  Las pldoras anticonceptivas de intervalo extendido vienen en envases de 91pldoras. Los primeros 84comprimidos tienen estrgeno y Theatre stage manager. Las ltimas 7pldoras son placebos. El sangrado menstrual se produce [employer] se toma placebo. Con este programa, el sangrado menstrual se produce una vez cada .  Las pldoras anticonceptivas continuas vienen en envases de 28pldoras. Todas las pldoras del envase contienen estrgeno y progestina. Con este programa, no se produce el sangrado menstrual regular, pero puede haber manchas o sangrado irregular. Pastillas de progestina sola Este tipo de pldora se denomina a menudo "minipldora" y contiene solo la hormona progestina. Viene en paquetes de 28 pldoras. En algunos envases, las ltimas 4pldoras son placebos. La pldora debe tomarse a la [employer]. Esto es muy importante para Location manager. Es posible que el sangrado menstrual no sea regular o predecible.   Cules son las ventajas? Los anticonceptivos orales proporcionan un mtodo anticonceptivo confiable y continuo si se toman segn las indicaciones. Pueden tratar o disminuir los sntomas de lo siguiente:  Clicos durante los perodos menstruales.  Ciclos menstruales o sangrado irregulares.  Flujo menstrual abundante.  Sangrado uterino anormal.  Acn, segn el tipo de pldora.  Sndrome del ovario poliqustico (SOP).  Endometriosis.  Anemia por deficiencia de hierro.  Sntomas premenstruales, incluidos  irritabilidad, depresin o ansiedad intensas.  Tambin pueden:  Reducir el riesgo de cncer de endometrio y de ovario.  Usarse como anticonceptivo de Associate Professor.  Evitar los [employer] ectpicos y las infecciones de las trompas de [place]. Qu puede hacer que los ACO sean menos eficaces? Los ACO pueden ser menos eficaces si:  Se olvida de tomar la [employer]. En el caso de las pldoras de progestina sola, es especialmente importante tomar la pldora a la misma hora Management consultant. Incluso tomarla 3horas tarde puede aumentar el riesgo de [place].  Tiene una enfermedad estomacal o intestinal que reduce la capacidad del cuerpo para absorber la pldora.  Toma los ACO junto con otros medicamentos que los hacen menos efectivos, como antibiticos, ciertos medicamentos para el VIH y algunos medicamentos para las convulsiones.  Toma ACO que han vencido.  Se olvida de reiniciar la toma de la pldora despus de 7das de no tomarla. Esto se refiere a los envases de 21pldoras. Cules son los efectos secundarios y los riesgos? Los ACO a Designer, television/film set causar [employer], como:  Dolor de [place].  Depresin.  Dificultad para dormir.  Nuseas y vmitos.  Dolor a Multimedia programmer.  Sangrado o manchado irregulares durante los primeros meses.  Meteorismo o retencin de lquidos.  Aumento de la presin arterial. Las pldoras combinadas pueden aumentar ligeramente el riesgo de:  Cogulos de [place].  Infarto de miocardio.  Accidente cerebrovascular. Siga estas instrucciones en su casa: Siga las indicaciones del mdico acerca de cmo comenzar a Building services engineer de ACO. En funcin de cundo comienza a Chief of Staff, es posible que deba usar un mtodo anticonceptivo de respaldo, como preservativos, durante la primera semana. Asegrese de saber qu hacer si se olvida de tomar la pldora. Resumen  Los anticonceptivos orales (ACO) son medicamentos que se toman  por va oral para Catering manager. Son muy efectivos cuando se toman exactamente como se indica.  Los ACO contienen una combinacin de las hormonas estrgeno y progestina (progesterona sinttica) o progestina sola.  Antes de comenzar a tomar anticonceptivos orales, debe hacerse un examen fsico, anlisis de [employer] y Neomia Dear prueba de Papanicolaou. El mdico se asegurar de que usted sea [place] buena candidata para usar anticonceptivos orales.  La pldora combinada puede venir en paquetes de 21, 28 o 91das. Las pldoras de progestina sola vienen en envases de 28pldoras.  Los ACO a Designer, television/film set causar [employer], como dolor de [place], nuseas, dolor a la palpacin en las mamas o sangrado irregular. Esta informacin no tiene Theme park manager el consejo del mdico. Asegrese de hacerle al mdico cualquier pregunta que tenga. Document Revised: 09/24/2020 Document Reviewed: 09/24/2020 Elsevier Patient Education  2021 [employer].

## 2022-04-08 IMAGING — DX DG CHEST 1V PORT
1 series · 1 of 1 positions shown · non-contrast
Comparison: 08/26/2019

CLINICAL DATA: Shortness of breath

EXAM:
PORTABLE CHEST 1 VIEW

[chest]
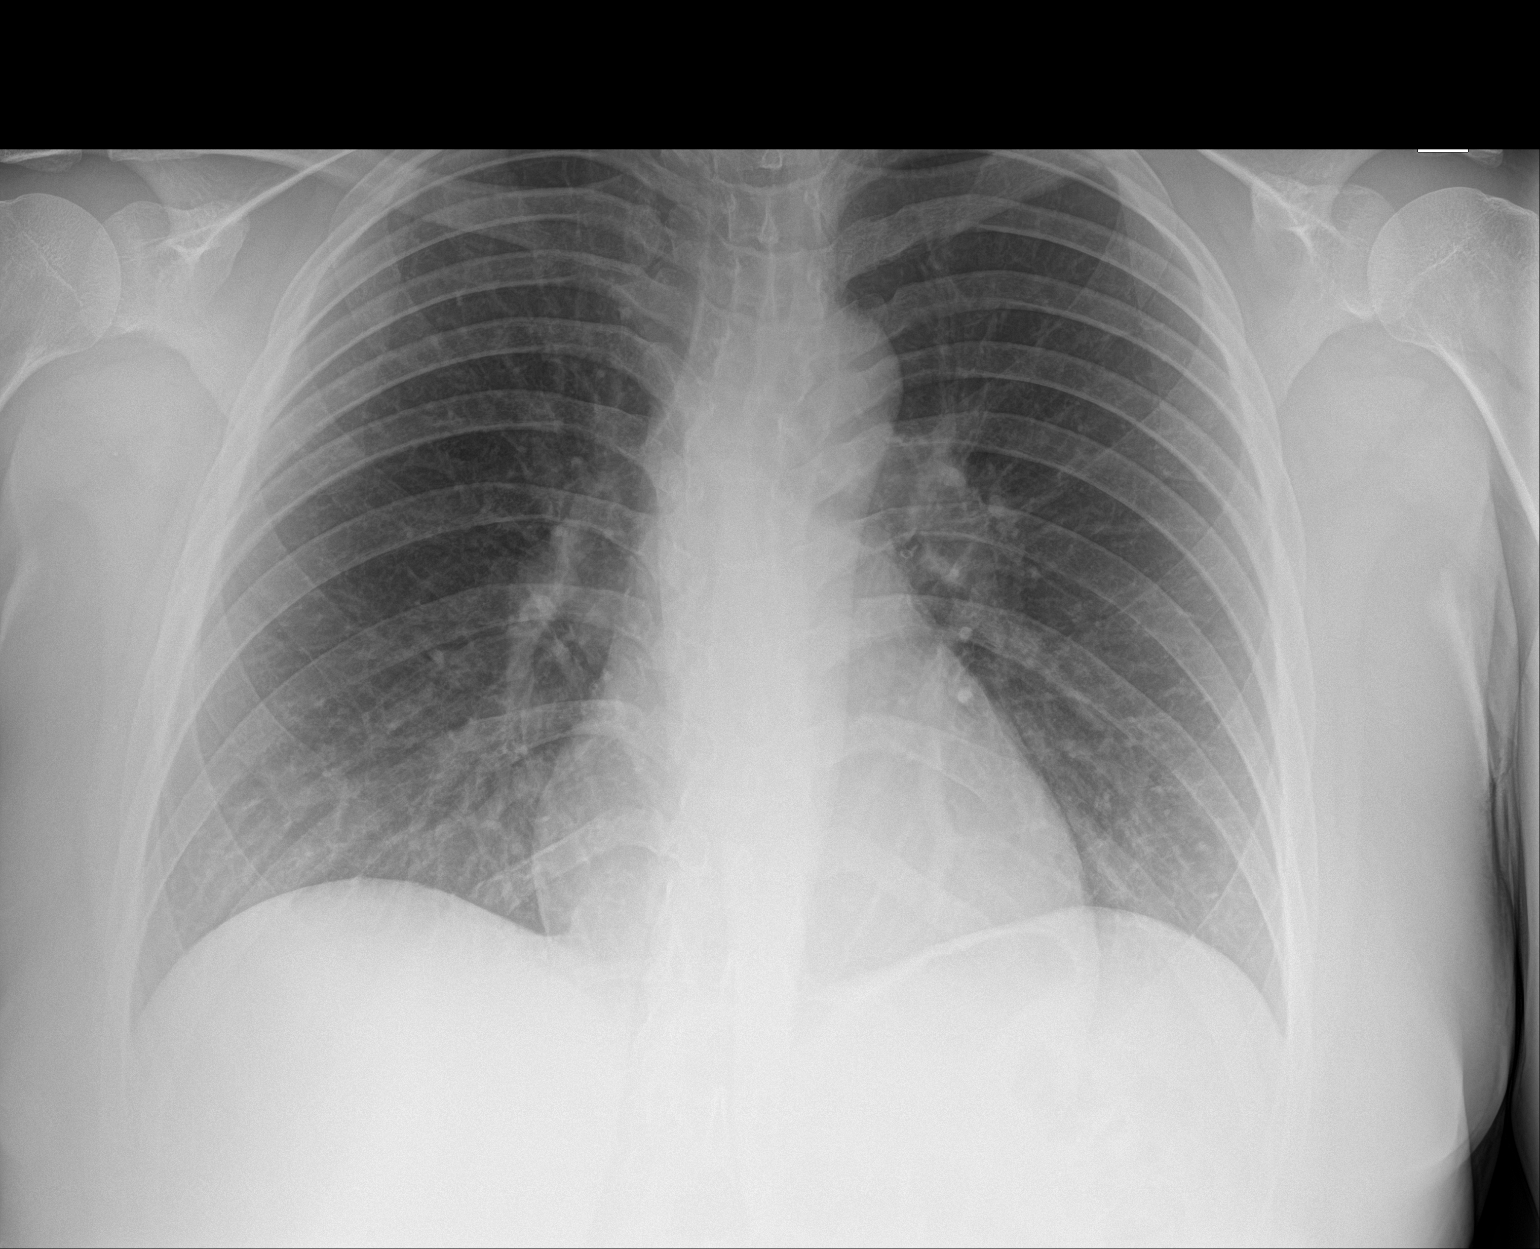

[1 of 1 positions shown; findings below may reference images not displayed]

FINDINGS: The heart size and mediastinal contours are within normal limits.
Both lungs are clear. The visualized skeletal structures are
unremarkable.
IMPRESSION: No acute cardiopulmonary process.

## 2022-07-08 ENCOUNTER — Encounter: Payer: Self-pay | Admitting: Radiology

## 2022-07-08 ENCOUNTER — Ambulatory Visit (INDEPENDENT_AMBULATORY_CARE_PROVIDER_SITE_OTHER): Payer: 59 | Admitting: Radiology

## 2022-07-08 VITALS — BP 166/64 | Ht 69.0 in | Wt 267.0 lb

## 2022-07-08 DIAGNOSIS — Z789 Other specified health status: Secondary | ICD-10-CM

## 2022-07-08 DIAGNOSIS — Z01419 Encounter for gynecological examination (general) (routine) without abnormal findings: Secondary | ICD-10-CM

## 2022-07-08 NOTE — Progress Notes (Signed)
   Allison Gray Aug 26, 1996 [id number]   History:  26 y.o. G0 presents for annual exam (Spanish video interpreter used for visit) Desires pregnancy, has been sexually active without BC x 1 year. Not tracking cycles or ovulation. Has not started PNV yet.  Gynecologic History Patient's last menstrual period was 06/10/2022 (approximate). Period Cycle (Days): 28 Period Duration (Days): 5 Period Pattern: Regular Menstrual Flow: Moderate (moderate to heavy) Dysmenorrhea: (!) Mild Dysmenorrhea Symptoms: Cramping Contraception/Family planning: none Sexually active: yes Last Pap: 2022. Results were: normal Last mammogram: n/a.   Obstetric History OB History  Gravida Para Term Preterm AB Living  0 0 0 0 0 0  SAB IAB Ectopic Multiple Live Births  0 0 0 0 0     The following portions of the patient's history were reviewed and updated as appropriate: allergies, current medications, past family history, past medical history, past social history, past surgical history, and problem list.  Review of Systems Pertinent items noted in HPI and remainder of comprehensive ROS otherwise negative.   Past medical history, past surgical history, family history and social history were all reviewed and documented in the EPIC chart.   Exam:  Vitals:   07/08/22 1111  BP: (!) 166/64  Weight: 267 lb (121.1 kg)  Height: _0  (1.753 m)   Body mass index is 39.43 kg/m.  General appearance:  Normal Thyroid:  Symmetrical, normal in size, without palpable masses or nodularity. Respiratory  Auscultation:  Clear without wheezing or rhonchi Cardiovascular  Auscultation:  Regular rate, without rubs, murmurs or gallops  Edema/varicosities:  Not grossly evident Abdominal  Soft,nontender, without masses, guarding or rebound.  Liver/spleen:  No organomegaly noted  Hernia:  None appreciated  Skin  Inspection:  Grossly normal Breasts: Examined lying and sitting.   Right: Without masses, retractions, nipple  discharge or axillary adenopathy.   Left: Without masses, retractions, nipple discharge or axillary adenopathy. Genitourinary   Inguinal/mons:  Normal without inguinal adenopathy  External genitalia:  Normal appearing vulva with no masses, tenderness, or lesions  BUS/Urethra/Skene's glands:  Normal without masses or exudate  Vagina:  Normal appearing with normal color and discharge, no lesions  Cervix:  Normal appearing without discharge or lesions  Uterus:  Normal in size, shape and contour.  Mobile, nontender  Adnexa/parametria:     Rt: Normal in size, without masses or tenderness.   Lt: Normal in size, without masses or tenderness.  Anus and perineum: Normal   Patient informed chaperone available to be present for breast and pelvic exam. Patient has requested no chaperone to be present. Patient has been advised what will be completed during breast and pelvic exam.   Assessment/Plan:   1. Well woman exam with routine gynecological exam Pap due 2025  2. Attempting to conceive Start PNV Begin tracking cycles and using ovulation predictor kit monthly. Discussed timing intercourse around ovulation.     Discussed SBE, colonoscopy and DEXA screening as directed/appropriate. Recommend 183mns of exercise weekly, including weight bearing exercise. Encouraged the use of seatbelts and sunscreen. Return in 1 year for annual or as needed.   CKerry DoryWHNP-BC 11:37 AM 07/08/2022

## 2022-10-08 ENCOUNTER — Ambulatory Visit (INDEPENDENT_AMBULATORY_CARE_PROVIDER_SITE_OTHER): Payer: Commercial Managed Care - HMO | Admitting: Radiology

## 2022-10-08 ENCOUNTER — Encounter: Payer: Self-pay | Admitting: Radiology

## 2022-10-08 VITALS — BP 104/62 | Wt 270.0 lb

## 2022-10-08 DIAGNOSIS — Z23 Encounter for immunization: Secondary | ICD-10-CM

## 2022-10-08 DIAGNOSIS — N912 Amenorrhea, unspecified: Secondary | ICD-10-CM | POA: Diagnosis not present

## 2022-10-08 DIAGNOSIS — N898 Other specified noninflammatory disorders of vagina: Secondary | ICD-10-CM

## 2022-10-08 DIAGNOSIS — N760 Acute vaginitis: Secondary | ICD-10-CM | POA: Diagnosis not present

## 2022-10-08 DIAGNOSIS — B9689 Other specified bacterial agents as the cause of diseases classified elsewhere: Secondary | ICD-10-CM

## 2022-10-08 LAB — WET PREP FOR TRICH, YEAST, CLUE

## 2022-10-08 LAB — PREGNANCY, URINE: Preg Test, Ur: NEGATIVE

## 2022-10-08 MED ORDER — MEDROXYPROGESTERONE ACETATE 10 MG PO TABS: 10.0000 mg | ORAL_TABLET | Freq: Every day | ORAL | 0 refills | Status: DC

## 2022-10-08 MED ORDER — METRONIDAZOLE 0.75 % VA GEL: 1.0000 | Freq: Every day | VAGINAL | 0 refills | Status: DC

## 2022-10-08 NOTE — Progress Notes (Signed)
   Allison Gray Oct 09, 1996 [id number]   History:  26 y.o. (Spanish speaking, video interpreter) G0 presents for follow up. Has not had a period since last seen 05/2022. Was trying for pregnancy, now unsure. C/o vaginal odor, no itching or burning.  Gynecologic History Patient's last menstrual period was 06/10/2022 (approximate).   Contraception/Family planning: none Sexually active: yes Last Pap: 12/12/20. Results were: normal   Obstetric History OB History  Gravida Para Term Preterm AB Living  0 0 0 0 0 0  SAB IAB Ectopic Multiple Live Births  0 0 0 0 0     The following portions of the patient's history were reviewed and updated as appropriate: allergies, current medications, past family history, past medical history, past social history, past surgical history, and problem list.  Review of Systems Pertinent items noted in HPI and remainder of comprehensive ROS otherwise negative.   Past medical history, past surgical history, family history and social history were all reviewed and documented in the EPIC chart.   Exam:  Vitals:   10/08/22 1052  BP: 104/62  Weight: 270 lb (122.5 kg)   Body mass index is 39.87 kg/m.  General appearance:  Normal Genitourinary   Inguinal/mons:  Normal without inguinal adenopathy  External genitalia:  Normal appearing vulva with no masses, tenderness, or lesions  BUS/Urethra/Skene's glands:  Normal without masses or exudate  Vagina:  +white discharge with odor, no lesions  Cervix:  Normal appearing without discharge or lesions  Uterus:  Normal in size, shape and contour.  Mobile, nontender  Adnexa/parametria:     Rt: Normal in size, without masses or tenderness.   Lt: Normal in size, without masses or tenderness.  Anus and perineum: Normal   Raynelle Fanning, CMA present  Assessment/Plan:   1. Amenorrhea Will begin provera and then drawn cycle day 3 labs to get a better look at hormones and possible cause of amenorrhea. States she may  consider holding off on pregnancy and depending what labs show she may want to start OCPs instead. Will follow up with patient once lab results are back.  - Pregnancy, urine - LH; Future - Estradiol; Future - HgB A1c; Future - Testos,Total,Free and SHBG (Female); Future - Thyroid Panel With TSH; Future - Prolactin; Future - medroxyPROGESTERone (PROVERA) 10 MG tablet; Take 1 tablet (10 mg total) by mouth daily.  Dispense: 10 tablet; Refill: 0  2. Need for immunization against influenza - Flu Vaccine QUAD 37mo+IM (Fluarix, Fluzone & Alfiuria Quad PF)  3. Vaginal odor +BV, rx sent for metrogel - WET PREP FOR TRICH, YEAST, CLUE  A total of was spent face to face with this patient including counseling and management. All questions answered. Will f/u once labs are received.    Arlie Solomons B WHNP-BC 11:26 AM 10/08/2022

## 2022-10-19 ENCOUNTER — Telehealth: Payer: Self-pay

## 2022-10-19 NOTE — Telephone Encounter (Signed)
Please advise 

## 2022-10-19 NOTE — Telephone Encounter (Signed)
It will likely take 10 days for her to start her period after finishing the medication, call for CD3 labs at that point

## 2022-10-19 NOTE — Telephone Encounter (Signed)
Msg forwarded to [facility] to translate and communicate/advise pt.

## 2022-10-19 NOTE — Telephone Encounter (Signed)
-----   Message from [place] sent at 10/18/2022  1:54 PM EST ----- Pt in clinic stating she finished her medication yesterday but has yet to start menses so can't have labs drawn.  Pt wants to know what to do now.  Please advise.

## 2022-10-20 NOTE — Telephone Encounter (Signed)
Per [facility]: "She spoke with pt and pt voiced understanding."

## 2022-11-04 ENCOUNTER — Ambulatory Visit
Admission: EM | Admit: 2022-11-04 | Discharge: 2022-11-04 | Disposition: A | Discharge status: Home / Self Care | Payer: Commercial Managed Care - HMO | Attending: Emergency Medicine | Admitting: Emergency Medicine

## 2022-11-04 DIAGNOSIS — J020 Streptococcal pharyngitis: Secondary | ICD-10-CM | POA: Diagnosis not present

## 2022-11-04 LAB — POCT RAPID STREP A (OFFICE): Rapid Strep A Screen: POSITIVE — AB

## 2022-11-04 MED ORDER — CEFDINIR 300 MG PO CAPS: 300.0000 mg | ORAL_CAPSULE | Freq: Two times a day (BID) | ORAL | 0 refills | Status: AC

## 2022-11-04 NOTE — ED Provider Notes (Signed)
[facility]    CSN: [id number] Arrival date & time: 11/04/22  1027    HISTORY   Chief Complaint  Patient presents with   Sore Throat   HPI Allison Gray is a pleasant, 26 y.o. female who presents to urgent care today. Patient reports a 1 week history of sore throat.  Patient states that her mother gave her some leftover amoxicillin which helped a little bit.  Rapid strep test today is positive.  Patient has normal vital signs on arrival today.  Patient denies headache, nausea, vomiting, difficulty swallowing, difficulty maintaining secretions, difficulty managing airway, known sick contacts.  The history is provided by the patient.   Past Medical History:  Diagnosis Date   Known health problems: none    Patient Active Problem List   Diagnosis Date Noted   Closed fracture of shaft of tibia and fibula, left, initial encounter 08/26/2019   Past Surgical History:  Procedure Laterality Date   TIBIA IM NAIL INSERTION Left 08/26/2019   Procedure: INTRAMEDULLARY (IM) NAIL TIBIAL FRACTURE;  Surgeon: Toni Arthurs, MD;  Location: [facility] OR;  Service: Orthopedics;  Laterality: Left;   OB History     Gravida  0   Para  0   Term  0   Preterm  0   AB  0   Living  0      SAB  0   IAB  0   Ectopic  0   Multiple  0   Live Births  0          Home Medications    Prior to Admission medications   Medication Sig Start Date End Date Taking? Authorizing Provider  medroxyPROGESTERone (PROVERA) 10 MG tablet Take 1 tablet (10 mg total) by mouth daily. 10/08/22   Chrzanowski, Jami B, NP  metroNIDAZOLE (METROGEL) 0.75 % vaginal gel Place 1 Applicatorful vaginally at bedtime. 10/08/22   Chrzanowski, Lamona Curl, NP    Family History Family History  Problem Relation Age of Onset   Hypertension Mother    Diabetes Father    Diabetes Maternal Grandmother    Skin cancer Paternal Grandmother    Thyroid disease Paternal Grandmother    Social History Social History    Tobacco Use   Smoking status: Never   Smokeless tobacco: Never  Vaping Use   Vaping Use: Never used  Substance Use Topics   Alcohol use: Never   Drug use: Never   Allergies   Patient has no known allergies.  Review of Systems Review of Systems Pertinent findings revealed after performing a 14 point review of systems has been noted in the history of present illness.  Physical Exam Triage Vital Signs ED Triage Vitals  Enc Vitals Group     BP 09/25/21 0827 (!) 147/82     Pulse Rate 09/25/21 0827 72     Resp 09/25/21 0827 18     Temp 09/25/21 0827 98.3 F (36.8 C)     Temp Source 09/25/21 0827 Oral     SpO2 09/25/21 0827 98 %     Weight --      Height --      Head Circumference --      Peak Flow --      Pain Score 09/25/21 0826 5     Pain Loc --      Pain Edu? --      Excl. in GC? --   No data found.  Updated Vital Signs BP 111/75 (BP Location: Left Arm)  Pulse 64   Temp 98.5 F (36.9 C) (Oral)   Resp 16   LMP 10/21/2022 (Approximate)   SpO2 96%   Physical Exam Constitutional:      General: She is awake. She is not in acute distress.    Appearance: She is well-developed. She is morbidly obese. She is not ill-appearing or toxic-appearing.  HENT:     Head: Normocephalic and atraumatic.     Salivary Glands: Right salivary gland is not diffusely enlarged or tender. Left salivary gland is not diffusely enlarged or tender.     Right Ear: Hearing and external ear normal.     Left Ear: Hearing and external ear normal.     Ears:     Comments: Bilateral EACs with mild erythema, bilateral TMs are normal    Nose: Nose normal. No mucosal edema, congestion or rhinorrhea.     Right Turbinates: Not enlarged, swollen or pale.     Left Turbinates: Not enlarged or swollen.     Right Sinus: No maxillary sinus tenderness or frontal sinus tenderness.     Left Sinus: No maxillary sinus tenderness or frontal sinus tenderness.     Mouth/Throat:     Lips: Pink. No lesions.      Mouth: Mucous membranes are moist. No oral lesions or angioedema.     Dentition: No gingival swelling.     Tongue: No lesions.     Palate: No mass.     Pharynx: Uvula midline. Posterior oropharyngeal erythema present. No pharyngeal swelling, oropharyngeal exudate or uvula swelling.     Tonsils: Tonsillar exudate present. 2+ on the right. 2+ on the left.  Eyes:     Extraocular Movements: Extraocular movements intact.     Conjunctiva/sclera: Conjunctivae normal.     Pupils: Pupils are equal, round, and reactive to light.  Neck:     Thyroid: No thyroid mass, thyromegaly or thyroid tenderness.     Trachea: Tracheal tenderness present. No abnormal tracheal secretions or tracheal deviation.     Comments: Voice is muffled Cardiovascular:     Rate and Rhythm: Normal rate and regular rhythm.     Pulses: Normal pulses.     Heart sounds: Normal heart sounds, S1 normal and S2 normal. No murmur heard.    No friction rub. No gallop.  Pulmonary:     Effort: Pulmonary effort is normal. No accessory muscle usage, prolonged expiration, respiratory distress or retractions.     Breath sounds: No stridor, decreased air movement or transmitted upper airway sounds. No decreased breath sounds, wheezing, rhonchi or rales.  Abdominal:     General: Bowel sounds are normal.     Palpations: Abdomen is soft.     Tenderness: There is generalized abdominal tenderness. There is no right CVA tenderness, left CVA tenderness or rebound. Negative signs include Murphy's sign.     Hernia: No hernia is present.  Musculoskeletal:        General: No tenderness. Normal range of motion.     Cervical back: Full passive range of motion without pain, normal range of motion and neck supple.     Right lower leg: No edema.     Left lower leg: No edema.  Lymphadenopathy:     Cervical: Cervical adenopathy present.     Right cervical: Superficial cervical adenopathy present.     Left cervical: Superficial cervical adenopathy present.   Skin:    General: Skin is warm and dry.     Findings: No erythema, lesion or rash.  Neurological:     General: No focal deficit present.     Mental Status: She is alert and oriented to person, place, and time. Mental status is at baseline.  Psychiatric:        Mood and Affect: Mood normal.        Behavior: Behavior normal. Behavior is cooperative.        Thought Content: Thought content normal.        Judgment: Judgment normal.     Visual Acuity Right Eye Distance:   Left Eye Distance:   Bilateral Distance:    Right Eye Near:   Left Eye Near:    Bilateral Near:     UC Couse / Diagnostics / Procedures:     Radiology No results found.  Procedures Procedures (including critical care time) EKG  Pending results:  Labs Reviewed  POCT RAPID STREP A (OFFICE)    Medications Ordered in UC: Medications - No data to display  UC Diagnoses / Final Clinical Impressions(s)   I have reviewed the triage vital signs and the nursing notes.  Pertinent labs & imaging results that were available during my care of the patient were reviewed by me and considered in my medical decision making (see chart for details).    Final diagnoses:  Acute streptococcal pharyngitis   Patient provided with a prescription for cefdinir for 10 days.  Patient advised to never take antibiotics are not prescribed for her.  Return precautions advised.  ED Prescriptions     Medication Sig Dispense Auth. Provider   cefdinir (OMNICEF) 300 MG capsule Take 1 capsule (300 mg total) by mouth 2 (two) times daily for 10 days. 20 capsule Theadora RamaMorgan, Makai Dumond Scales, PA-C      PDMP not reviewed this encounter.  Disposition Upon Discharge:  Condition: stable for discharge home Home: take medications as prescribed; routine discharge instructions as discussed; follow up as advised.  Patient presented with an acute illness with associated systemic symptoms and significant discomfort requiring urgent management. In my  opinion, this is a condition that a prudent lay person (someone who possesses an average knowledge of health and medicine) may potentially expect to result in complications if not addressed urgently such as respiratory distress, impairment of bodily function or dysfunction of bodily organs.   Routine symptom specific, illness specific and/or disease specific instructions were discussed with the patient and/or caregiver at length.   As such, the patient has been evaluated and assessed, work-up was performed and treatment was provided in alignment with urgent care protocols and evidence based medicine.  Patient/parent/caregiver has been advised that the patient may require follow up for further testing and treatment if the symptoms continue in spite of treatment, as clinically indicated and appropriate.  If the patient was tested for COVID-19, Influenza and/or RSV, then the patient/parent/guardian was advised to isolate at home pending the results of his/her diagnostic coronavirus test and potentially longer if they're positive. I have also advised pt that if his/her COVID-19 test returns positive, it's recommended to self-isolate for at least 10 days after symptoms first appeared AND until fever-free for 24 hours without fever reducer AND other symptoms have improved or resolved. Discussed self-isolation recommendations as well as instructions for household member/close contacts as per the [facility] and [place] DHHS, and also gave patient the COVID packet with this information.  Patient/parent/caregiver has been advised to [facility] or [facility]CP in 3-5 days if no better; to PCP or the Emergency Department if new signs and symptoms  develop, or if the current signs or symptoms continue to change or worsen for further workup, evaluation and treatment as clinically indicated and appropriate  The patient will follow up with their current PCP if and as advised. If the patient does not currently have a PCP we will assist  them in obtaining one.   The patient may need specialty follow up if the symptoms continue, in spite of conservative treatment and management, for further workup, evaluation, consultation and treatment as clinically indicated and appropriate.  Patient/parent/caregiver verbalized understanding and agreement of plan as discussed.  All questions were addressed during visit.  Please see discharge instructions below for further details of plan.  Discharge Instructions:   Discharge Instructions      Your strep test today is positive.  I recommend that you begin antibiotics now for treatment.  I have sent a prescription to your pharmacy.  Please take them as prescribed.  You will begin to feel better in about 24 hours.  Please be sure that you you finish the entire 10-day course of treatment to avoid worsening infection that may require longer treatment with stronger antibiotics.   After 24 hours of taking antibiotics, you are no longer contagious.  Please discard your toothbrush as well as any other oral devices that you are currently using and replace them with new ones to avoid reinfection.   Please read below to learn more about the medications, dosages and frequencies that I recommend to help alleviate your symptoms and to get you feeling better soon:   Omnicef (cefdinir):  1 capsule twice daily for 10 days, you can take it with or without food.  This antibiotic can cause upset stomach, this will resolve once antibiotics are complete.  You are welcome to use a probiotic, eat yogurt, take Imodium while taking this medication.  Please avoid other systemic medications such as Maalox, Pepto-Bismol or milk of magnesia as they can interfere with your body's ability to absorb the antibiotics.       Never take antibiotics that have not been prescribed for you.   Advil, Motrin (ibuprofen): This is a good anti-inflammatory medication which addresses aches, pains and inflammation of the upper airways that  causes sinus and nasal congestion as well as in the lower airways which makes your cough feel tight and sometimes burn.  I recommend that you take between 400 to 600 mg every 6-8 hours as needed.      Please follow-up within the next 7-10 days either with your primary care provider or urgent care if your symptoms do not resolve.  If you do not have a primary care provider, we will assist you in finding one.        Thank you for visiting urgent care today.  We appreciate the opportunity to participate in your care.         This office note has been dictated using Teaching laboratory technician.  Unfortunately, this method of dictation can sometimes lead to typographical or grammatical errors.  I apologize for your inconvenience in advance if this occurs.  Please do not hesitate to reach out to me if clarification is needed.      Theadora Rama Scales, PA-C 11/04/22 1139

## 2022-11-04 NOTE — Discharge Instructions (Addendum)
Tu prueba de estreptococos de hoy es positiva. Le recomiendo que comience el tratamiento con antibiticos ahora. He enviado una receta a su farmacia. Tmelos segn lo prescrito. Comenzar a sentirse mejor en aproximadamente 24 horas. Asegrese de terminar todo el tratamiento de 10 das para evitar que la infeccin empeore, lo que podra requerir un tratamiento ms prolongado con antibiticos ms fuertes.   Despus de 24 horas de tomar antibiticos, ya no eres contagioso. Deseche su cepillo de dientes y cualquier otro dispositivo bucal que est utilizando actualmente y reemplcelos por otros nuevos para evitar una reinfeccin.   Lea a continuacin para obtener ms informacin [employer], las dosis y las frecuencias que recomiendo para ayudar a Paramedic sus sntomas y Radio producer que se sienta mejor pronto:   Research officer, trade union (cefdinir): 1 cpsula dos veces al da durante 10 das, puedes tomarlo con o sin alimentos. Este antibitico puede causar Programme researcher, broadcasting/film/video, esto se resolver una vez que se completen los antibiticos. Puede usar un probitico, Occupational hygienist y tomar Imodium mientras toma este medicamento. Evite otros medicamentos sistmicos como Maalox, Pepto-Bismol o leche de magnesia, ya que pueden interferir con la capacidad de su cuerpo para Agricultural engineer antibiticos.       Nunca tome antibiticos que no le hayan recetado.   Advil, Motrin (ibuprofeno): este es un buen medicamento antiinflamatorio que trata los dolores y la inflamacin de las vas respiratorias superiores que causan congestin nasal y sinusal, as como en las vas respiratorias inferiores, lo que hace que la tos se sienta tirante y, a veces, arde. Te recomiendo que tomes entre 400 y 600 mg cada 6-8 horas segn sea necesario.   Realice un seguimiento dentro de los prximos 7 a [address], ya sea con su proveedor de atencin primaria o con atencin de urgencia si sus sntomas no desaparecen. Si no tiene un proveedor de [employer], lo  ayudaremos a Dealer.        Gracias por visitar la atencin de urgencia hoy. Apreciamos la oportunidad de Systems analyst atencin.   Your strep test today is positive.  I recommend that you begin antibiotics now for treatment.  I have sent a prescription to your pharmacy.  Please take them as prescribed.  You will begin to feel better in about 24 hours.  Please be sure that you you finish the entire 10-day course of treatment to avoid worsening infection that may require longer treatment with stronger antibiotics.   After 24 hours of taking antibiotics, you are no longer contagious.  Please discard your toothbrush as well as any other oral devices that you are currently using and replace them with new ones to avoid reinfection.   Please read below to learn more about the medications, dosages and frequencies that I recommend to help alleviate your symptoms and to get you feeling better soon:   Omnicef (cefdinir):  1 capsule twice daily for 10 days, you can take it with or without food.  This antibiotic can cause upset stomach, this will resolve once antibiotics are complete.  You are welcome to use a probiotic, eat yogurt, take Imodium while taking this medication.  Please avoid other systemic medications such as Maalox, Pepto-Bismol or milk of magnesia as they can interfere with your body's ability to absorb the antibiotics.       Never take antibiotics that have not been prescribed for you.   Advil, Motrin (ibuprofen): This is a good anti-inflammatory medication which addresses aches, pains and inflammation of the upper  airways that causes sinus and nasal congestion as well as in the lower airways which makes your cough feel tight and sometimes burn.  I recommend that you take between 400 to 600 mg every 6-8 hours as needed.      Please follow-up within the next 7-10 days either with your primary care provider or urgent care if your symptoms do not resolve.  If you do not have a primary  care provider, we will assist you in finding one.        Thank you for visiting urgent care today.  We appreciate the opportunity to participate in your care.

## 2022-11-04 NOTE — ED Triage Notes (Signed)
Pt c/o sore throat that began about a weeks ago.  Home interventions: amoxicillin given to her from her mother

## 2022-12-12 ENCOUNTER — Ambulatory Visit
Admission: EM | Admit: 2022-12-12 | Discharge: 2022-12-12 | Disposition: A | Discharge status: Home / Self Care | Payer: 59 | Attending: Physician Assistant | Admitting: Physician Assistant

## 2022-12-12 ENCOUNTER — Encounter: Payer: Self-pay | Admitting: Emergency Medicine

## 2022-12-12 DIAGNOSIS — J069 Acute upper respiratory infection, unspecified: Secondary | ICD-10-CM

## 2022-12-12 DIAGNOSIS — R6889 Other general symptoms and signs: Secondary | ICD-10-CM | POA: Diagnosis not present

## 2022-12-12 MED ORDER — IBUPROFEN 100 MG/5ML PO SUSP: 600.0000 mg | Freq: Once | ORAL | Status: DC

## 2022-12-12 MED ORDER — IBUPROFEN 600 MG PO TABS
600.0000 mg | ORAL_TABLET | Freq: Once | ORAL | Status: AC
Start: 2022-12-12 — End: 2022-12-12
  Administered 2022-12-12: 600 mg via ORAL

## 2022-12-12 NOTE — ED Triage Notes (Signed)
Patient c/o cough, congestion, bodyaches and sore throat x 3 days.  Patient has taken Tylenol and Thera-Flu.

## 2022-12-12 NOTE — Discharge Instructions (Signed)
  You can take up to 800 mg of ibuprofen 4 times a day- Please do not take on empty stomach.   You can take up to 1000 mg of tylenol 4 times a day.  I recommend alternating tylenol and ibuprofen.

## 2022-12-13 ENCOUNTER — Encounter: Payer: Self-pay | Admitting: Physician Assistant

## 2022-12-13 NOTE — ED Provider Notes (Signed)
[facility]    CSN: [id number] Arrival date & time: 12/12/22  1035      History   Chief Complaint Chief Complaint  Patient presents with   Influenza    Entered by patient    HPI Allison Gray is a 27 y.o. female.   Patient here today for evaluation of cough, congestion, body aches and sore throat that started 3 days ago.  She has had fever as well.  She has tried taking Tylenol and TheraFlu without resolution.  She denies any vomiting or diarrhea.  The history is provided by the patient. The history is limited by a language barrier. A language interpreter was used Medical laboratory scientific officer).  Influenza Presenting symptoms: cough, fever, myalgias and sore throat   Presenting symptoms: no diarrhea, no nausea, no shortness of breath and no vomiting   Associated symptoms: chills and nasal congestion     Past Medical History:  Diagnosis Date   Known health problems: none     Patient Active Problem List   Diagnosis Date Noted   Closed fracture of shaft of tibia and fibula, left, initial encounter 08/26/2019    Past Surgical History:  Procedure Laterality Date   TIBIA IM NAIL INSERTION Left 08/26/2019   Procedure: INTRAMEDULLARY (IM) NAIL TIBIAL FRACTURE;  Surgeon: Wylene Simmer, MD;  Location: [place];  Service: Orthopedics;  Laterality: Left;    OB History     Gravida  0   Para  0   Term  0   Preterm  0   AB  0   Living  0      SAB  0   IAB  0   Ectopic  0   Multiple  0   Live Births  0            Home Medications    Prior to Admission medications   Medication Sig Start Date End Date Taking? Authorizing Provider  medroxyPROGESTERone (PROVERA) 10 MG tablet Take 1 tablet (10 mg total) by mouth daily. 10/08/22   Chrzanowski, Annitta Needs, NP    Family History Family History  Problem Relation Age of Onset   Hypertension Mother    Diabetes Father    Diabetes Maternal Grandmother    Skin cancer Paternal Grandmother    Thyroid disease Paternal  Grandmother     Social History Social History   Tobacco Use   Smoking status: Never   Smokeless tobacco: Never  Vaping Use   Vaping Use: Never used  Substance Use Topics   Alcohol use: Never   Drug use: Never     Allergies   Patient has no known allergies.   Review of Systems Review of Systems  Constitutional:  Positive for chills and fever.  HENT:  Positive for congestion and sore throat.   Respiratory:  Positive for cough. Negative for shortness of breath.   Gastrointestinal:  Negative for diarrhea, nausea and vomiting.  Musculoskeletal:  Positive for myalgias.     Physical Exam Triage Vital Signs ED Triage Vitals  Enc Vitals Group     BP 12/12/22 1054 106/72     Pulse Rate 12/12/22 1054 (!) 107     Resp 12/12/22 1054 20     Temp 12/12/22 1054 (!) 103 F (39.4 C)     Temp Source 12/12/22 1054 Oral     SpO2 12/12/22 1054 91 %     Weight 12/12/22 1056 277 lb (125.6 kg)     Height 12/12/22 1056 5\' 8"  (1.727 m)  Head Circumference --      Peak Flow --      Pain Score 12/12/22 1056 7     Pain Loc --      Pain Edu? --      Excl. in [place]? --    No data found.  Updated Vital Signs BP 106/72 (BP Location: Right Arm)   Pulse (!) 107   Temp (!) 102.8 F (39.3 C) (Oral)   Resp 20   Ht 5\' 8"  (0.240 m)   Wt 277 lb (125.6 kg)   LMP 10/23/2022 (Exact Date)   SpO2 91%   BMI 42.12 kg/m      Physical Exam Vitals and nursing note reviewed.  Constitutional:      General: She is not in acute distress.    Appearance: She is not ill-appearing.     Comments: Appears to not feel well  HENT:     Head: Normocephalic and atraumatic.     Nose: Congestion present.     Mouth/Throat:     Mouth: Mucous membranes are moist.     Pharynx: No oropharyngeal exudate or posterior oropharyngeal erythema.  Eyes:     Conjunctiva/sclera: Conjunctivae normal.  Cardiovascular:     Rate and Rhythm: Normal rate and regular rhythm.     Heart sounds: Normal heart sounds. No murmur  heard. Pulmonary:     Effort: Pulmonary effort is normal. No respiratory distress.     Breath sounds: Normal breath sounds. No wheezing, rhonchi or rales.  Skin:    General: Skin is warm and dry.  Neurological:     Mental Status: She is alert.  Psychiatric:        Mood and Affect: Mood normal.        Thought Content: Thought content normal.      UC Treatments / Results  Labs (all labs ordered are listed, but only abnormal results are displayed) Labs Reviewed - No data to display  EKG   Radiology No results found.  Procedures Procedures (including critical care time)  Medications Ordered in UC Medications  ibuprofen (ADVIL) tablet 600 mg (600 mg Oral Given 12/12/22 1107)    Initial Impression / Assessment and Plan / UC Course  I have reviewed the triage vital signs and the nursing notes.  Pertinent labs & imaging results that were available during my care of the patient were reviewed by me and considered in my medical decision making (see chart for details).    Given high fever suspect most likely influenza. Will treat with tamiflu and encouraged increased fluids, alternating tylenol and ibuprofen. Encouraged follow up if no gradual improvement or with any worsening.   Final Clinical Impressions(s) / UC Diagnoses   Final diagnoses:  Flu-like symptoms  Acute upper respiratory infection     Discharge Instructions       You can take up to 800 mg of ibuprofen 4 times a day- Please do not take on empty stomach.   You can take up to 1000 mg of tylenol 4 times a day.  I recommend alternating tylenol and ibuprofen.     ED Prescriptions   None    PDMP not reviewed this encounter.   Francene Finders, PA-C 12/13/22 1020

## 2022-12-20 ENCOUNTER — Encounter: Payer: Self-pay | Admitting: Nurse Practitioner

## 2022-12-20 ENCOUNTER — Ambulatory Visit (INDEPENDENT_AMBULATORY_CARE_PROVIDER_SITE_OTHER): Payer: 59 | Admitting: Nurse Practitioner

## 2022-12-20 VITALS — BP 104/78 | HR 81

## 2022-12-20 DIAGNOSIS — L680 Hirsutism: Secondary | ICD-10-CM

## 2022-12-20 DIAGNOSIS — N912 Amenorrhea, unspecified: Secondary | ICD-10-CM

## 2022-12-20 MED ORDER — MEDROXYPROGESTERONE ACETATE 10 MG PO TABS: 10.0000 mg | ORAL_TABLET | Freq: Every day | ORAL | 0 refills | Status: DC

## 2022-12-20 NOTE — Progress Notes (Signed)
   Acute Office Visit  Subjective:    Patient ID: Allison Gray, female    DOB: 12-13-1995, 27 y.o.   MRN: [id number]   HPI 28 y.o. presents today for amenorrhea. Has always had irregular menses since menarche. She thinks she longest she has gone without menses was 2 months. Seen in November with recommendations for Provera and to return cycle day 3 for lab work. Unfortunately this landed at Thanksgiving and she was not able to schedule. Negative UPT 1 week ago. Complains of female pattern hair growth. Family history of diabetes. No known family history of PCOS. Audio interpreter used for entirety of visit.    Review of Systems  Constitutional: Negative.   Endocrine: Negative for cold intolerance and heat intolerance.  Genitourinary:  Positive for menstrual problem.  Skin:        Female pattern hair growth       Objective:    Physical Exam Constitutional:      Appearance: Normal appearance.   GU: Not indicated  BP 104/78   Pulse 81   LMP 10/19/2022 (Exact Date)   SpO2 97%  Wt Readings from Last 3 Encounters:  12/12/22 277 lb (125.6 kg)  10/08/22 270 lb (122.5 kg)  07/08/22 267 lb (121.1 kg)       Assessment & Plan:   Problem List Items Addressed This Visit   None Visit Diagnoses     Amenorrhea    -  Primary   Relevant Medications   medroxyPROGESTERone (PROVERA) 10 MG tablet   Other Relevant Orders   Estradiol   Luteinizing hormone   Hemoglobin B4W   Follicle stimulating hormone   Testos,Total,Free and SHBG (Female)   Prolactin   Hirsutism       Relevant Orders   Testos,Total,Free and SHBG (Female)      Plan: PCOS workup today. Provera 10 mg daily x 10 days. Return cycle day 3 for lab work. All questions answered.      Tamela Gammon DNP, 4:35 PM 12/20/2022

## 2022-12-22 ENCOUNTER — Telehealth: Payer: Self-pay

## 2022-12-22 DIAGNOSIS — N912 Amenorrhea, unspecified: Secondary | ICD-10-CM

## 2022-12-22 MED ORDER — MEDROXYPROGESTERONE ACETATE 10 MG PO TABS: 10.0000 mg | ORAL_TABLET | Freq: Every day | ORAL | 0 refills | Status: DC

## 2022-12-22 NOTE — Telephone Encounter (Addendum)
Patient walked in so she could speak with [place] employee. She talked with Alvie Heidelberg.  She reports that her insurance no longer covers Rx at [facility] and she needs [place] to remove that from her chart and add [facility].  I did this so pharmacy is now correct.  She requested that Jonelle Sidle, NP resend the Rx Provera 10mg  to her corrected pharmacy so ins will cover it.

## 2022-12-22 NOTE — Telephone Encounter (Signed)
Sent.  Thank you.

## 2023-01-24 ENCOUNTER — Telehealth: Payer: Self-pay

## 2023-01-24 NOTE — Telephone Encounter (Signed)
Patient called in triage voice mail and left message in [place]. I routed it to [place] to interpret. Allison Gray said the message just asked [place] to call her.  Allison Gray called her but her voice mail box was not set up and she could not leave a message.

## 2023-01-25 ENCOUNTER — Other Ambulatory Visit: Payer: 59

## 2023-01-25 DIAGNOSIS — L680 Hirsutism: Secondary | ICD-10-CM

## 2023-01-25 DIAGNOSIS — N912 Amenorrhea, unspecified: Secondary | ICD-10-CM

## 2023-01-30 LAB — FOLLICLE STIMULATING HORMONE: FSH: 6 m[IU]/mL

## 2023-01-30 LAB — LUTEINIZING HORMONE: LH: 2.4 m[IU]/mL

## 2023-01-30 LAB — HEMOGLOBIN A1C
Hgb A1c MFr Bld: 5.5 % of total Hgb (ref <5.7)
Mean Plasma Glucose: 111 mg/dL
eAG (mmol/L): 6.2 mmol/L

## 2023-01-30 LAB — PROLACTIN: Prolactin: 9.8 ng/mL

## 2023-01-30 LAB — TESTOS,TOTAL,FREE AND SHBG (FEMALE)
Free Testosterone: 3.3 pg/mL (ref 0.1–6.4)
Sex Hormone Binding: 38.4 nmol/L (ref 17–124)
Testosterone, Total, LC-MS-MS: 28 ng/dL (ref 2–45)

## 2023-01-30 LAB — ESTRADIOL: Estradiol: 69 pg/mL

## 2023-02-03 ENCOUNTER — Encounter: Payer: Self-pay | Admitting: Nurse Practitioner

## 2023-02-03 ENCOUNTER — Ambulatory Visit (INDEPENDENT_AMBULATORY_CARE_PROVIDER_SITE_OTHER): Payer: 59 | Admitting: Nurse Practitioner

## 2023-02-03 VITALS — BP 102/76 | HR 78

## 2023-02-03 DIAGNOSIS — N926 Irregular menstruation, unspecified: Secondary | ICD-10-CM

## 2023-02-03 DIAGNOSIS — Z789 Other specified health status: Secondary | ICD-10-CM

## 2023-02-03 NOTE — Progress Notes (Signed)
   Acute Office Visit  Subjective:    Patient ID: Allison Gray, female    DOB: 08-18-1996, 27 y.o.   MRN: [id number]   HPI 27 y.o. G0 presents today for follow up on irregular menses. Seen 12/20/2022 with history of  irregular menses since menarche with the longest being 2 months. Normal LH, FSH, estradiol, testosterone, prolactin, A1c. Regular with COCs in the past. She is wanting to become pregnant. Does not track cycles/ovulation. Withdrawal bleed with Provera 01/22/2023. Feels she is having ovulation symptoms which she has never noticed before. Interpreter present during visit.    Review of Systems  Constitutional: Negative.   Genitourinary:  Positive for menstrual problem.       Objective:    Physical Exam Constitutional:      Appearance: Normal appearance.   GU: Not indicated  BP 102/76 (BP Location: Left Arm, Patient Position: Sitting, Cuff Size: Large)   Pulse 78   LMP 01/22/2023   SpO2 97%  Wt Readings from Last 3 Encounters:  12/12/22 277 lb (125.6 kg)  10/08/22 270 lb (122.5 kg)  07/08/22 267 lb (121.1 kg)        Assessment & Plan:   Problem List Items Addressed This Visit   None Visit Diagnoses     Irregular menses    -  Primary   Relevant Orders   US PELVIS TRANSVAGINAL NON-OB (TV ONLY)   Attempting to conceive          Plan: Normal hormone panel. Will schedule pelvic ultrasound. Recommend cycle tracking with app, increasing intercourse during ovulation window, start PNV. Reviewed ovulation signs, OPKs and BBT. Discussed option for cyclic provera if no menses by cycle day 30 with UPT prior. Will discuss more after ultrasound.      [place], 4:16 PM 02/03/2023

## 2023-03-17 ENCOUNTER — Ambulatory Visit: Payer: 59

## 2023-03-17 ENCOUNTER — Other Ambulatory Visit: Payer: 59 | Admitting: Nurse Practitioner

## 2023-03-24 ENCOUNTER — Ambulatory Visit (INDEPENDENT_AMBULATORY_CARE_PROVIDER_SITE_OTHER): Payer: 59

## 2023-03-24 DIAGNOSIS — N926 Irregular menstruation, unspecified: Secondary | ICD-10-CM

## 2023-03-28 ENCOUNTER — Emergency Department (HOSPITAL_COMMUNITY)
Admission: EM | Admit: 2023-03-28 | Discharge: 2023-03-28 | Disposition: A | Discharge status: Home / Self Care | Payer: 59 | Attending: Emergency Medicine | Admitting: Emergency Medicine

## 2023-03-28 ENCOUNTER — Encounter (HOSPITAL_COMMUNITY): Payer: Self-pay

## 2023-03-28 ENCOUNTER — Other Ambulatory Visit: Payer: Self-pay

## 2023-03-28 ENCOUNTER — Emergency Department (HOSPITAL_COMMUNITY): Payer: 59

## 2023-03-28 DIAGNOSIS — J40 Bronchitis, not specified as acute or chronic: Secondary | ICD-10-CM | POA: Insufficient documentation

## 2023-03-28 DIAGNOSIS — Z1152 Encounter for screening for COVID-19: Secondary | ICD-10-CM | POA: Diagnosis not present

## 2023-03-28 DIAGNOSIS — R0602 Shortness of breath: Secondary | ICD-10-CM | POA: Diagnosis present

## 2023-03-28 LAB — CBC WITH DIFFERENTIAL/PLATELET
Abs Immature Granulocytes: 0.02 10*3/uL (ref 0.00–0.07)
Basophils Absolute: 0.1 10*3/uL (ref 0.0–0.1)
Basophils Relative: 1 %
Eosinophils Absolute: 0.9 10*3/uL — ABNORMAL HIGH (ref 0.0–0.5)
Eosinophils Relative: 10 %
HCT: 42 % (ref 36.0–46.0)
Hemoglobin: 13.6 g/dL (ref 12.0–15.0)
Immature Granulocytes: 0 %
Lymphocytes Relative: 27 %
Lymphs Abs: 2.3 10*3/uL (ref 0.7–4.0)
MCH: 28.2 pg (ref 26.0–34.0)
MCHC: 32.4 g/dL (ref 30.0–36.0)
MCV: 87 fL (ref 80.0–100.0)
Monocytes Absolute: 0.6 10*3/uL (ref 0.1–1.0)
Monocytes Relative: 7 %
Neutro Abs: 4.8 10*3/uL (ref 1.7–7.7)
Neutrophils Relative %: 55 %
Platelets: 328 10*3/uL (ref 150–400)
RBC: 4.83 MIL/uL (ref 3.87–5.11)
RDW: 13.9 % (ref 11.5–15.5)
WBC: 8.6 10*3/uL (ref 4.0–10.5)
nRBC: 0 % (ref 0.0–0.2)

## 2023-03-28 LAB — BASIC METABOLIC PANEL
Anion gap: 9 (ref 5–15)
BUN: 11 mg/dL (ref 6–20)
CO2: 24 mmol/L (ref 22–32)
Calcium: 8.9 mg/dL (ref 8.9–10.3)
Chloride: 103 mmol/L (ref 98–111)
Creatinine, Ser: 0.73 mg/dL (ref 0.44–1.00)
GFR, Estimated: >60 mL/min (ref >60)
Glucose, Bld: 100 mg/dL — ABNORMAL HIGH (ref 70–99)
Potassium: 3.9 mmol/L (ref 3.5–5.1)
Sodium: 136 mmol/L (ref 135–145)

## 2023-03-28 LAB — PREGNANCY, URINE: Preg Test, Ur: NEGATIVE

## 2023-03-28 LAB — SARS CORONAVIRUS 2 BY RT PCR: SARS Coronavirus 2 by RT PCR: NEGATIVE

## 2023-03-28 MED ORDER — ALBUTEROL SULFATE HFA 108 (90 BASE) MCG/ACT IN AERS
2.0000 | INHALATION_SPRAY | Freq: Once | RESPIRATORY_TRACT | Status: AC
  Administered 2023-03-28: 2 via RESPIRATORY_TRACT
  Filled 2023-03-28: qty 6.7

## 2023-03-28 MED ORDER — BENZONATATE 100 MG PO CAPS
100.0000 mg | ORAL_CAPSULE | Freq: Once | ORAL | Status: AC
  Administered 2023-03-28: 100 mg via ORAL
  Filled 2023-03-28: qty 1

## 2023-03-28 MED ORDER — CETIRIZINE HCL 10 MG PO TABS: 10.0000 mg | ORAL_TABLET | Freq: Every day | ORAL | 0 refills | Status: AC

## 2023-03-28 MED ORDER — BENZONATATE 100 MG PO CAPS: 100.0000 mg | ORAL_CAPSULE | Freq: Three times a day (TID) | ORAL | 0 refills | Status: AC

## 2023-03-28 NOTE — ED Notes (Signed)
Patient verbalizes understanding of discharge instructions. Opportunity for questioning and answers were provided. Armband removed by staff, pt discharged from ED. Pt ambulatory to ED waiting room with steady gait.  

## 2023-03-28 NOTE — ED Provider Triage Note (Signed)
Emergency Medicine Provider Triage Evaluation Note  Allison Gray , a 27 y.o. female  was evaluated in triage.  Pt complains of cough productive of small amounts of white sputum ongoing for the past week, now is getting short of breath with walking and sometimes when lying down.  She states she has had bronchitis in the past and feels like she never fully healed and wanted her lungs to be evaluated today.  She states she felt like she had a fever last night but did not check her temperature.  No history of VTE, no OCP use or other hormonal medication use, no lower extremity swelling or pain.  Review of Systems  Positive: Cough, shortness of breath, subjective fevers Negative: Vomiting, abdominal pain  Physical Exam  BP 131/88 (BP Location: Right Arm)   Pulse 69   Temp 98.5 F (36.9 C) (Oral)   Resp 18   Ht 5\' 8"  (1.727 m)   Wt 122.5 kg   SpO2 98%   BMI 41.05 kg/m  Gen:   Awake, no distress   Resp:  Normal effort  MSK:   Moves extremities without difficulty  Other:    Medical Decision Making  Medically screening exam initiated at 2:21 PM.  Appropriate orders placed.  Saadiya Wilfong was informed that the remainder of the evaluation will be completed by another provider, this initial triage assessment does not replace that evaluation, and the importance of remaining in the ED until their evaluation is complete.     Ma Rings, [place] 03/28/23 1422

## 2023-03-28 NOTE — ED Provider Notes (Signed)
[place] EMERGENCY DEPARTMENT AT [facility] Provider Note   CSN: [id number] Arrival date & time: 03/28/23  1343     History  Chief Complaint  Patient presents with   Shortness of Breath    Allison Gray is a 27 y.o. female with no known past medical history presenting today with 3 weeks worth of cough occasionally productive of green sputum, shortness of breath, watery eyes and congestion.  Has not seen a primary care doctor.  Also says that she feels worse in the evening.  Has a history of bronchitis and says that this feels similarly.  No fevers, chills, leg swelling, recent travel or surgery   Shortness of Breath Associated symptoms: cough and sore throat   Associated symptoms: no fever        Home Medications Prior to Admission medications   Not on File      Allergies    Patient has no known allergies.    Review of Systems   Review of Systems  Constitutional:  Negative for chills and fever.  HENT:  Positive for congestion and sore throat.   Respiratory:  Positive for cough and shortness of breath.     Physical Exam Updated Vital Signs BP 131/88 (BP Location: Right Arm)   Pulse 69   Temp 98.5 F (36.9 C) (Oral)   Resp 18   Ht 5\' 8"  (1.727 m)   Wt 122.5 kg   SpO2 98%   BMI 41.05 kg/m  Physical Exam Vitals and nursing note reviewed.  Constitutional:      General: She is not in acute distress.    Appearance: Normal appearance. She is not ill-appearing.  HENT:     Head: Normocephalic and atraumatic.     Mouth/Throat:     Mouth: Mucous membranes are moist.  Eyes:     General: No scleral icterus.    Conjunctiva/sclera: Conjunctivae normal.  Cardiovascular:     Rate and Rhythm: Normal rate and regular rhythm.  Pulmonary:     Effort: Pulmonary effort is normal. No respiratory distress.     Breath sounds: No decreased breath sounds or wheezing.  Musculoskeletal:     Right lower leg: No tenderness. No edema.     Left lower leg: No  tenderness. No edema.  Skin:    Findings: No rash.  Neurological:     Mental Status: She is alert.  Psychiatric:        Mood and Affect: Mood normal.     ED Results / Procedures / Treatments   Labs (all labs ordered are listed, but only abnormal results are displayed) Labs Reviewed  CBC WITH DIFFERENTIAL/PLATELET - Abnormal; Notable for the following components:      Result Value   Eosinophils Absolute 0.9 (*)    All other components within normal limits  BASIC METABOLIC PANEL - Abnormal; Notable for the following components:   Glucose, Bld 100 (*)    All other components within normal limits  SARS CORONAVIRUS 2 BY RT PCR  PREGNANCY, URINE  POC URINE PREG, ED    EKG EKG Interpretation  Date/Time:  Monday March 28 2023 14:05:38 EDT Ventricular Rate:  64 PR Interval:  156 QRS Duration: 102 QT Interval:  408 QTC Calculation: 420 R Axis:   1 Text Interpretation: Normal sinus rhythm Cannot rule out Anterior infarct , age undetermined Abnormal ECG No previous ECGs available Confirmed by Richardean Canal ([id number]) on 03/28/2023 5:57:00 PM  Radiology DG Chest 2 View  Result Date:  03/28/2023 CLINICAL DATA:  Cough, shortness of breath EXAM: CHEST - 2 VIEW COMPARISON:  12/19/2020 FINDINGS: There is poor inspiration. There are no signs of pulmonary edema or focal pulmonary consolidation. There is no pleural effusion or pneumothorax. IMPRESSION: No active cardiopulmonary disease. Electronically Signed   By: Ernie Avena M.D.   On: 03/28/2023 14:46    Procedures Procedures   Medications Ordered in ED Medications  albuterol (VENTOLIN HFA) 108 (90 Base) MCG/ACT inhaler 2 puff (2 puffs Inhalation Given 03/28/23 1831)  benzonatate (TESSALON) capsule 100 mg (100 mg Oral Given 03/28/23 1832)    ED Course/ Medical Decision Making/ A&P                             Medical Decision Making Risk OTC drugs. Prescription drug management.   27 year old female presenting today with 3  weeks worth of a cough, shortness of breath, congestion.  Differential includes but is not limited to viral illness, pneumonia, PE, heart failure, ACS, bronchitis, arrhythmia, pericarditis this is not an exhaustive differential.    Past Medical History / Co-morbidities / Social History: No known medical problems     Physical Exam: Pertinent physical exam findings include Lung sounds clear, no leg swelling, positive DP pulse, RRR  Lab Tests: I ordered, and personally interpreted labs.  The pertinent results include: Within normal limits   Imaging Studies: Chest x-ray negative   Medications: Inhaler and benzonatate  MDM/Disposition: This is a 27 year old female who presented today due to congestion, cough, shortness of breath worse with exertion as well as sore throat for the past 3 weeks.  Differential was broad and inclusive of PE, ACS, pneumonia and viral illness.  Patient is PERC negative.  Her shortness of breath and cough in conjunction with other viral symptoms increase my suspicion for viral syndrome and not emergent condition such as PE.  X-ray without any findings of pneumothorax or pneumonia.  Additionally, patient has no leukocytosis, fever or tachycardia.  Do not believe she has an infectious process that needs further workup or imaging.  EKG nonischemic.  At this time I believe patient is suffering from viral syndrome.  Potentially seasonal allergies.  She says that it feels similar to when she had bronchitis.  I will give her an inhaler and Tessalon Perles.  She will be discharged with the same.  Strict return precautions were given and she was given a referral to family medicine.  She also requested a referral to pulmonology which I have placed.  Patient was thankful for her care and discharged.    Of note, patient used iPhone app as an interpreter.  All questions were answered.  Final Clinical Impression(s) / ED Diagnoses Final diagnoses:  Bronchitis    Rx / DC  Orders ED Discharge Orders          Ordered    benzonatate (TESSALON) 100 MG capsule  Every 8 hours        03/28/23 1817    cetirizine (ZYRTEC) 10 MG tablet  Daily        03/28/23 1817    Ambulatory referral to [facility]        03/28/23 1819    Ambulatory referral to Pulmonology        03/28/23 1819           Results and diagnoses were explained to the patient. Return precautions discussed in full. Patient had no additional questions and expressed complete understanding.  This chart was dictated using voice recognition software.  Despite best efforts to proofread,  errors can occur which can change the documentation meaning.     Woodroe Chen 03/28/23 2233    Charlynne Pander, MD 03/28/23 [phone]

## 2023-03-28 NOTE — Discharge Instructions (Addendum)
Llegaste hoy al servicio de urgencias con dificultad para respirar, algo de tos, algo de congestin que empeoraba por la noche.  Como hemos comentado, su trabajo de laboratorio se ve bien.  La radiografa de trax tampoco muestra ninguna neumona.  Le dieron Cabin crew usar en cualquier momento que sienta que le falta el aire.  Tambin envi un medicamento llamado benzonatato a su farmacia para la tos.  La cetirizina es un medicamento para las Environmental consultant.  Esto tambin se puede comprar sin receta.  Se han colocado sus derivaciones de Optician, dispensing y neumologa.  Si no te llaman con tus nmeros que aparecen en estos papeles.  Ha sido un placer conocerte y esperamos que te sientas mejor!  Si cualquier sntoma empeora, especialmente el empeoramiento de la dificultad para respirar, Chief Technology Officer en el pecho, el mareo o la hinchazn de las piernas, regrese al departamento de emergencias ms cercano.  (You came to the emergency department today with shortness of breath, some cough some congestion that was worse at nighttime.  As we discussed, your lab work looks good.  Your chest x-ray also does not show any pneumonia.You were given an inhaler that you may use anytime that you feel short of breath.  I also sent a medication called benzonatate to your pharmacy for your cough.Cetirizine is a medication for allergies.  This can also be purchased over-the-counter.  With any worsening symptoms, especially worsening shortness of breath, chest pain, lightheadedness or swelling in your legs please return to the nearest emergency department.  Your family medicine and pulmonology referrals have been placed.  If they do not call you with your numbers on these papers.  It was a pleasure to meet you and we hope you feel better!)

## 2023-03-28 NOTE — ED Triage Notes (Signed)
Pt arrived POV from home c/o [facility] with exertion, and at night she has a cough, congestion and a sore throat x1-2 weeks.

## 2023-03-29 ENCOUNTER — Ambulatory Visit: Payer: 59 | Admitting: Nurse Practitioner

## 2023-03-29 VITALS — BP 120/82

## 2023-03-29 DIAGNOSIS — N926 Irregular menstruation, unspecified: Secondary | ICD-10-CM

## 2023-03-29 DIAGNOSIS — Z789 Other specified health status: Secondary | ICD-10-CM

## 2023-03-29 MED ORDER — MEDROXYPROGESTERONE ACETATE 10 MG PO TABS
10.0000 mg | ORAL_TABLET | Freq: Every day | ORAL | 0 refills | Status: DC
Start: 2023-03-29 — End: 2024-10-09

## 2023-03-29 NOTE — Progress Notes (Signed)
   Acute Office Visit  Subjective:    Patient ID: Allison Gray, female    DOB: [birth year], 27 y.o.   MRN: [id number]   HPI 27 y.o. G0 presents today for ultrasound consult. H/O irregular menses since menarche with the longest being 2 months. Normal LH, FSH, estradiol, testosterone, prolactin, A1c. Regular with COCs in the past. She is wanting to become pregnant. Recommended cycle/ovulation tracking last visit. Withdrawal bleed with Provera 01/22/2023, spontaneous light period 03/08/23. Interpreter present during visit.   Patient's last menstrual period was 03/08/2023 (exact date).    Review of Systems  Constitutional: Negative.   Genitourinary:  Positive for menstrual problem.       Objective:    Physical Exam Constitutional:      Appearance: Normal appearance.   GU: Not indicated  BP 120/82 (BP Location: Right Arm, Patient Position: Sitting, Cuff Size: Large)   LMP 03/08/2023 (Exact Date)  Wt Readings from Last 3 Encounters:  03/28/23 270 lb (122.5 kg)  12/12/22 277 lb (125.6 kg)  10/08/22 270 lb (122.5 kg)        Assessment & Plan:   Problem List Items Addressed This Visit   None Visit Diagnoses     Irregular menses    -  Primary   Relevant Medications   medroxyPROGESTERone (PROVERA) 10 MG tablet   Attempting to conceive       Relevant Medications   medroxyPROGESTERone (PROVERA) 10 MG tablet   Other Relevant Orders   Progesterone      Vaginal ultrasound: Anteverted uterus, normal size and shape, no myometrial masses.  Thin, symmetrical endometrium - 1.068 mm.  No masses or thickening seen. Both ovaries mobile, normal size with normal follicle pattern.  No adnexal masses, no free fluid.  Plan: Reviewed normal ultrasound. Provera monthly if no menses, UPT prior. Return for 21-day progesterone. Discussed starting clomid if appropriate after labs. Continue to track cycles.    Olivia Mackie DNP, 4:07 PM 03/29/2023

## 2023-05-09 ENCOUNTER — Other Ambulatory Visit: Payer: 59

## 2023-08-10 NOTE — Progress Notes (Signed)
GYNECOLOGY  VISIT   HPI: 27 y.o.   Single    female   G0P0000 with Patient's last menstrual period was 08/24/2023.   here for   irregular menses f/u-- wants to discuss fertility. Trying for 2 years but her periods were not regular.   Interpretor present.  Menses are irregular. Can occur every 2 months and has about 8 menses per year. Recent periods have been: May 07/29/23 08/24/23  No ovulation monitoring.   She was told to do a day #21 lab.   Normal labs on 01/25/23:  estradiol, LH, FSH, testosterone, prolactin.  Normal TSH 12/12/20. Normal pelvic US 03/28/23.  No prior pregnancies.   From [place].   GYNECOLOGIC HISTORY: Patient's last menstrual period was 08/24/2023. Contraception:  never took provera Menopausal hormone therapy:  n/a Last mammogram:  n/a Last pap smear:   12/12/20 neg        OB History     Gravida  0   Para  0   Term  0   Preterm  0   AB  0   Living  0      SAB  0   IAB  0   Ectopic  0   Multiple  0   Live Births  0              Patient Active Problem List   Diagnosis Date Noted   Abnormal gait 10/28/2019   Closed fracture of shaft of tibia and fibula, left, initial encounter 08/26/2019    Past Medical History:  Diagnosis Date   Known health problems: none     Past Surgical History:  Procedure Laterality Date   TIBIA IM NAIL INSERTION Left 08/26/2019   Procedure: INTRAMEDULLARY (IM) NAIL TIBIAL FRACTURE;  Surgeon: Toni Arthurs, MD;  Location: [facility] OR;  Service: Orthopedics;  Laterality: Left;    Current Outpatient Medications  Medication Sig Dispense Refill   albuterol (VENTOLIN HFA) 108 (90 Base) MCG/ACT inhaler Inhale into the lungs every 6 (six) hours as needed for wheezing or shortness of breath.     benzonatate (TESSALON) 100 MG capsule Take 1 capsule (100 mg total) by mouth every 8 (eight) hours. 21 capsule 0   cetirizine (ZYRTEC) 10 MG tablet Take 1 tablet (10 mg total) by mouth daily. 30 tablet 0    medroxyPROGESTERone (PROVERA) 10 MG tablet Take 1 tablet (10 mg total) by mouth daily. Take 1 tablet daily x 5 days if no menses 15 tablet 0   No current facility-administered medications for this visit.     ALLERGIES: Patient has no known allergies.  Family History  Problem Relation Age of Onset   Hypertension Mother    Diabetes Father    Diabetes Maternal Grandmother    Skin cancer Paternal Grandmother    Thyroid disease Paternal Grandmother     Social History   Socioeconomic History   Marital status: Single    Spouse name: Not on file   Number of children: Not on file   Years of education: Not on file   Highest education level: Not on file  Occupational History   Not on file  Tobacco Use   Smoking status: Never   Smokeless tobacco: Never  Vaping Use   Vaping status: Never Used  Substance and Sexual Activity   Alcohol use: Never   Drug use: Never   Sexual activity: Yes    Partners: Male  Other Topics Concern   Not on file  Social History Narrative  Not on file   Social Determinants of Health   Financial Resource Strain: Not on file  Food Insecurity: Not on file  Transportation Needs: Not on file  Physical Activity: Not on file  Stress: Not on file  Social Connections: Not on file  Intimate Partner Violence: Not on file    Review of Systems  All other systems reviewed and are negative.   PHYSICAL EXAMINATION:    BP 128/84 (BP Location: Left Arm, Patient Position: Sitting, Cuff Size: Large)   Pulse 91   Ht 5\' 8"  (1.727 m)   Wt 270 lb (122.5 kg)   LMP 08/24/2023   SpO2 95%   BMI 41.05 kg/m     General appearance: alert, cooperative and appears stated age   ASSESSMENT  Primary infertility.  Oligomenorrhea.    PLAN  We discussed fertility evaluation for ovulation, semen analysis, and tubal patency.  She will return for a day #21 progesterone.  We also discussed ovulation monitoring kits and basal body temperature monitoring.  Semen analysis  communication form given to patient to have her husband sign.  When this is returned, will give SA kit to patient or her husband.  Will make referral for hysterosalpingogram at [facility].  Procedure explained.  Start PNV Flu vaccine and Covid vaccines discussed.   Will check Rubella titer and TSH when patient comes in for her day #21 progesterone.  We did discuss the possibility of medication for ovulation induction.  FU after tests are back.  40 min  total time was spent for this patient encounter, including preparation, face-to-face counseling with the patient, coordination of care, and documentation of the encounter.

## 2023-08-24 ENCOUNTER — Encounter: Payer: Self-pay | Admitting: Obstetrics and Gynecology

## 2023-08-24 ENCOUNTER — Ambulatory Visit (INDEPENDENT_AMBULATORY_CARE_PROVIDER_SITE_OTHER): Payer: 59 | Admitting: Obstetrics and Gynecology

## 2023-08-24 DIAGNOSIS — N979 Female infertility, unspecified: Secondary | ICD-10-CM | POA: Diagnosis not present

## 2023-08-24 DIAGNOSIS — N915 Oligomenorrhea, unspecified: Secondary | ICD-10-CM | POA: Diagnosis not present

## 2023-08-24 DIAGNOSIS — Z3169 Encounter for other general counseling and advice on procreation: Secondary | ICD-10-CM

## 2023-08-25 ENCOUNTER — Other Ambulatory Visit: Payer: 59

## 2023-08-26 ENCOUNTER — Telehealth: Payer: Self-pay | Admitting: Obstetrics and Gynecology

## 2023-08-26 DIAGNOSIS — N979 Female infertility, unspecified: Secondary | ICD-10-CM

## 2023-08-26 NOTE — Telephone Encounter (Signed)
Please make referral for hysterosalpingogram to be done at [facility].   My patient has primary female infertility.

## 2023-08-26 NOTE — Telephone Encounter (Signed)
Referral placed.   Routing to Allison Gray FYI.   Encounter closed.  

## 2023-09-13 ENCOUNTER — Other Ambulatory Visit: Payer: 59

## 2024-04-13 ENCOUNTER — Encounter: Admitting: Obstetrics and Gynecology

## 2024-05-25 ENCOUNTER — Ambulatory Visit: Payer: Self-pay | Admitting: Radiology

## 2024-06-06 ENCOUNTER — Ambulatory Visit: Payer: Self-pay | Admitting: Radiology

## 2024-07-27 ENCOUNTER — Encounter: Payer: Self-pay | Admitting: Radiology

## 2024-07-27 ENCOUNTER — Ambulatory Visit (INDEPENDENT_AMBULATORY_CARE_PROVIDER_SITE_OTHER): Admitting: Radiology

## 2024-07-27 VITALS — BP 114/70 | HR 66 | Wt 248.0 lb

## 2024-07-27 DIAGNOSIS — N760 Acute vaginitis: Secondary | ICD-10-CM

## 2024-07-27 DIAGNOSIS — N912 Amenorrhea, unspecified: Secondary | ICD-10-CM

## 2024-07-27 DIAGNOSIS — N926 Irregular menstruation, unspecified: Secondary | ICD-10-CM

## 2024-07-27 DIAGNOSIS — Z113 Encounter for screening for infections with a predominantly sexual mode of transmission: Secondary | ICD-10-CM | POA: Diagnosis not present

## 2024-07-27 DIAGNOSIS — Z789 Other specified health status: Secondary | ICD-10-CM

## 2024-07-27 DIAGNOSIS — Z30018 Encounter for initial prescription of other contraceptives: Secondary | ICD-10-CM

## 2024-07-27 LAB — WET PREP FOR TRICH, YEAST, CLUE

## 2024-07-27 LAB — PREGNANCY, URINE: Preg Test, Ur: NEGATIVE

## 2024-07-27 MED ORDER — NUVESSA 1.3 % VA GEL
1.0000 | Freq: Once | VAGINAL | 0 refills | Status: AC
Start: 2024-07-27 — End: 2024-07-27

## 2024-07-27 MED ORDER — MEDROXYPROGESTERONE ACETATE 10 MG PO TABS
10.0000 mg | ORAL_TABLET | Freq: Every day | ORAL | 0 refills | Status: DC
Start: 2024-07-27 — End: 2024-10-09

## 2024-07-27 MED ORDER — PHEXXI 1.8-1-0.4 % VA GEL
5.0000 g | VAGINAL | 11 refills | Status: AC
Start: 2024-07-27 — End: ?

## 2024-07-27 NOTE — Progress Notes (Signed)
      Subjective: Allison Gray is a 28 y.o. female  accompanied by Spanish interpreter who complains of being late for menses after taking plan B  twice this summer. New sexual partners would like STI screening. C/o vaginal discharge, itching and burning used monistat 3 last week with no relief.   Would also like to discuss infertility. Had a partner x 6 years never got pregnant now his new partner is pregnant quickly.   Review of Systems  All other systems reviewed and are negative.   Past Medical History:  Diagnosis Date   Known health problems: none       Objective:  Today's Vitals   07/27/24 0810  BP: 114/70  Pulse: 66  SpO2: 99%  Weight: 248 lb (112.5 kg)   Body mass index is 37.71 kg/m.   Physical Exam Vitals and nursing note reviewed. Exam conducted with a chaperone present.  Constitutional:      Appearance: Normal appearance. She is well-developed.  Pulmonary:     Effort: Pulmonary effort is normal.  Abdominal:     General: Abdomen is flat.     Palpations: Abdomen is soft.  Genitourinary:    General: Normal vulva.     Vagina: Vaginal discharge present. No erythema, bleeding or lesions.     Cervix: Normal. No discharge, friability, lesion or erythema.     Uterus: Normal.      Adnexa: Right adnexa normal and left adnexa normal.  Neurological:     Mental Status: She is alert.  Psychiatric:        Mood and Affect: Mood normal.        Thought Content: Thought content normal.        Judgment: Judgment normal.      Microscopic wet-mount exam shows clue cells.   Darice Hoit, CMA present for exam  Assessment:/Plan:   1. Amenorrhea (Primary) - Pregnancy, urine; negative - medroxyPROGESTERone  (PROVERA ) 10 MG tablet; Take 1 tablet (10 mg total) by mouth daily.  Dispense: 10 tablet; Refill: 0  2. Acute vaginitis - WET PREP FOR TRICH, YEAST, CLUE - metroNIDAZOLE  (NUVESSA ) 1.3 % GEL; Place 1 Dose vaginally once for 1 dose.  Dispense: 5 g; Refill: 0  3.  Screening for STDs (sexually transmitted diseases) - SURESWAB CT/NG/T. vaginalis  4. Attempting to conceive Schedule another appt to discuss  5. Irregular menses  6. Encounter for initial prescription of other contraceptives - Lactic Ac-Citric Ac-Pot Bitart (PHEXXI ) 1.8-1-0.4 % GEL; Place 5 g vaginally as directed. Before intercourse  Dispense: 120 g; Refill: 11     43 minutes spent with patient and her multiple concerns  Jeffree Cazeau B, NP 9:31 AM

## 2024-07-27 NOTE — Patient Instructions (Signed)

## 2024-07-28 LAB — SURESWAB CT/NG/T. VAGINALIS
C. trachomatis RNA, TMA: NOT DETECTED
N. gonorrhoeae RNA, TMA: NOT DETECTED
Trichomonas vaginalis RNA: NOT DETECTED

## 2024-07-29 ENCOUNTER — Ambulatory Visit: Payer: Self-pay | Admitting: Radiology

## 2024-08-08 ENCOUNTER — Ambulatory Visit: Admitting: Radiology

## 2024-08-23 ENCOUNTER — Encounter (INDEPENDENT_AMBULATORY_CARE_PROVIDER_SITE_OTHER): Payer: Self-pay | Admitting: Nurse Practitioner

## 2024-08-23 ENCOUNTER — Ambulatory Visit (INDEPENDENT_AMBULATORY_CARE_PROVIDER_SITE_OTHER): Admitting: Nurse Practitioner

## 2024-08-23 VITALS — BP 112/75 | HR 60 | Temp 97.8°F | Ht 67.5 in | Wt 248.0 lb

## 2024-08-23 DIAGNOSIS — E66812 Obesity, class 2: Secondary | ICD-10-CM | POA: Diagnosis not present

## 2024-08-23 DIAGNOSIS — J4520 Mild intermittent asthma, uncomplicated: Secondary | ICD-10-CM | POA: Diagnosis not present

## 2024-08-23 DIAGNOSIS — Z6838 Body mass index (BMI) 38.0-38.9, adult: Secondary | ICD-10-CM | POA: Diagnosis not present

## 2024-08-23 NOTE — Progress Notes (Signed)
 [address] [place], [place], [place] [address] Office: [phone]  /  Fax: [phone]   Initial Consultation    Allison Gray was seen in clinic today to evaluate for obesity. She is interested in losing weight to improve overall health and reduce the risk of weight related complications. She presents today to review program treatment options, initial physical assessment, and evaluation.    Anthropometrics and Bioimpedance Analysis   Body mass index is 38.27 kg/m. Body Fat Mass : 53.3 % Visceral Fat Mass Rating : 12   Obesity Related Diseases and Complications  Obesity Quality of Life and Psychosocial Complications: Reduced health-related quality of life  Cardiometabolic: DOE and Fatigue  Biomechanical: Asthma   Weight Related History  She was referred by: Self-Referral  When asked what they would like to accomplish? She states: Adopt a healthier eating pattern and lifestyle, Improve energy levels and physical activity, Improve existing medical conditions, and Improve quality of life  Weight history: Started as a small child  Highest weight: 280  Contributing factors: family history of obesity, consumption of processed foods, strong orexigenic signaling and/or inadequate inhibitory control , sedentary job, and self - critic or all-or-none mindset  Prior weight loss attempts: None  Current or previous pharmacotherapy: None  Response to medication: Never tried medications  Current nutrition plan: Low-carb and High-protein  Greatest challenge with dieting: felt restrictive and drinks sugar sodas.  Current level of physical activity: Walking 20 minutes, seven a week and Strength training 20 minutes, seven  Barriers to Exercise: no barriers  Readiness and Motivation  On a scale from 0 to 10 How ready are you to make changes to your eating and physical activity to lose weight? 10 How important is it for you to lose weight right now ? 10 How confident are you that  you can lose weight if you try? 10  Past Medical History   Past Medical History:  Diagnosis Date   Known health problems: none      Objective    BP 112/75   Pulse 60   Temp 97.8 F (36.6 C)   Ht 5' 7.5 (1.715 m)   Wt 248 lb (112.5 kg)   LMP 05/01/2024 (Exact Date)   SpO2 98%   BMI 38.27 kg/m  She was weighed on the bioimpedance scale: Body mass index is 38.27 kg/m.    General:  Alert, oriented and cooperative. Patient is in no acute distress.  Respiratory: Normal respiratory effort, no problems with respiration noted   Gait: able to ambulate independently  Mental Status: Normal mood and affect. Normal behavior. Normal judgment and thought content.   Diagnostic Data Reviewed  BMET    Component Value Date/Time   NA 136 03/28/2023 1423   K 3.9 03/28/2023 1423   CL 103 03/28/2023 1423   CO2 24 03/28/2023 1423   GLUCOSE 100 (H) 03/28/2023 1423   BUN 11 03/28/2023 1423   CREATININE 0.73 03/28/2023 1423   CREATININE 0.73 12/12/2020 1311   CALCIUM 8.9 03/28/2023 1423   GFRNONAA >60 03/28/2023 1423   GFRAA >60 08/26/2019 1030   Lab Results  Component Value Date   HGBA1C 5.5 01/25/2023   No results found for: INSULIN CBC    Component Value Date/Time   WBC 8.6 03/28/2023 1423   RBC 4.83 03/28/2023 1423   HGB 13.6 03/28/2023 1423   HCT 42.0 03/28/2023 1423   PLT 328 03/28/2023 1423   MCV 87.0 03/28/2023 1423   MCH 28.2 03/28/2023 1423   MCHC  32.4 03/28/2023 1423   RDW 13.9 03/28/2023 1423   Iron/TIBC/Ferritin/ %Sat No results found for: IRON, TIBC, FERRITIN, IRONPCTSAT Lipid Panel  No results found for: CHOL, TRIG, HDL, CHOLHDL, VLDL, LDLCALC, LDLDIRECT Hepatic Function Panel     Component Value Date/Time   PROT 8.1 12/12/2020 1311   AST 15 12/12/2020 1311   ALT 12 12/12/2020 1311   BILITOT 0.5 12/12/2020 1311      Component Value Date/Time   TSH 1.37 12/12/2020 1311    Medications  Outpatient Encounter Medications as of  08/23/2024  Medication Sig   albuterol  (VENTOLIN  HFA) 108 (90 Base) MCG/ACT inhaler Inhale into the lungs every 6 (six) hours as needed for wheezing or shortness of breath.   Lactic Ac-Citric Ac-Pot Bitart (PHEXXI ) 1.8-1-0.4 % GEL Place 5 g vaginally as directed. Before intercourse   medroxyPROGESTERone  (PROVERA ) 10 MG tablet Take 1 tablet (10 mg total) by mouth daily.   benzonatate  (TESSALON ) 100 MG capsule Take 1 capsule (100 mg total) by mouth every 8 (eight) hours. (Patient not taking: Reported on 08/23/2024)   cetirizine  (ZYRTEC ) 10 MG tablet Take 1 tablet (10 mg total) by mouth daily. (Patient not taking: Reported on 08/23/2024)   medroxyPROGESTERone  (PROVERA ) 10 MG tablet Take 1 tablet (10 mg total) by mouth daily. Take 1 tablet daily x 5 days if no menses (Patient not taking: Reported on 08/23/2024)   No facility-administered encounter medications on file as of 08/23/2024.     Assessment and Plan   Mild intermittent asthma without complication  Class 2 obesity without serious comorbidity with body mass index (BMI) of 38.0 to 38.9 in adult, unspecified obesity type       Obesity Treatment and Action Plan:  Patient will work on garnering support from family and friends to begin weight loss journey. Will work on eliminating or reducing the presence of highly palatable, calorie dense foods in the home. Will complete provided nutritional and psychosocial assessment questionnaire before the next appointment. Will be scheduled for indirect calorimetry to determine resting energy expenditure in a fasting state.  This will allow us  to create a reduced calorie, high-protein meal plan to promote loss of fat mass while preserving muscle mass. Counseled on the health benefits of losing 5%-15% of total body weight. Was counseled on nutritional approaches to weight loss and benefits of reducing processed foods and consuming plant-based foods and high quality protein as part of nutritional weight  management. Was counseled on pharmacotherapy and role as an adjunct in weight management.   Education and Additional resources  She was weighed on the bioimpedance scale and results were discussed and documented in the synopsis.  We discussed obesity as a progressive, chronic disease and the importance of a more detailed evaluation of all the factors contributing to the disease.  We reviewed the basic principles in obesity management.   We discussed the importance of long term lifestyle changes which include nutrition, exercise and behavioral modification as well as the importance of customizing this to her specific health and social needs.  We reviewed the role of medical interventions including pharmacotherapy and surgical interventions.   We discussed the benefits of reaching a healthier weight to alleviate the symptoms of existing conditions and reduce the risks of the biomechanical, cardiometabolic and psychological effects of obesity.  We reviewed our program approach and philosophy, which are guided by the four pillars of obesity medicine.  We discussed how to prepare for intake appointment and the importance of fasting and avoidance of stimulants  for at least 8 hours prior to indirect calorimetry.  Allison Gray appears to be in the action stage of change and reports being ready to initiate intensive lifestyle and behavioral modifications as part of their weight loss journey.  Attestation  Reviewed by clinician on day of visit: allergies, medications, problem list, medical history, surgical history, family history, social history, and previous encounter notes pertinent to obesity diagnosis.  I have spent 39 minutes in the care of the patient today including: 1 minutes before the visit reviewing and preparing the chart. 30 minutes face-to-face assessing and reviewing listed medical problems as outlined in obesity care plan, providing nutritional and behavioral counseling on topics  outlined in the obesity care plan, independently interpreting test results and goals of care, as described in assessment and plan, reviewing and discussing biometric information and progress, and reviewing latest PCP notes and specialist consultations 8 minutes after the visit updating chart and documentation of encounter.  Banita Lehn ANP-C

## 2024-08-26 ENCOUNTER — Encounter: Payer: Self-pay | Admitting: Obstetrics and Gynecology

## 2024-10-09 ENCOUNTER — Encounter: Payer: Self-pay | Admitting: Radiology

## 2024-10-09 ENCOUNTER — Ambulatory Visit: Admitting: Radiology

## 2024-10-09 VITALS — BP 106/72 | Wt 253.0 lb

## 2024-10-09 DIAGNOSIS — N912 Amenorrhea, unspecified: Secondary | ICD-10-CM | POA: Diagnosis not present

## 2024-10-09 LAB — PREGNANCY, URINE: Preg Test, Ur: NEGATIVE

## 2024-10-09 MED ORDER — MEDROXYPROGESTERONE ACETATE 10 MG PO TABS
10.0000 mg | ORAL_TABLET | Freq: Every day | ORAL | 0 refills | Status: AC
Start: 2024-10-09 — End: ?

## 2024-10-09 NOTE — Progress Notes (Signed)
   Allison Gray [birth year] [id number]   History:  28 y.o. G0 here with Spanish interpreter c/o continues to skip periods. LMP 05/01/24. Was previously given Provera , did not take. Will need new Rx. She was referred to [facility] by Dr Nikki in 2024, but did not follow up with appt. Reassuring labs 07/2023.Was ordered a day 21 lab but did not come in for appt.  Gynecologic History Patient's last menstrual period was 05/01/2024.   Contraception/Family planning: none Sexually active: yes, trying for pregnancy Last Pap: 1/22. Results were: normal   Obstetric History OB History  Gravida Para Term Preterm AB Living  0 0 0 0 0 0  SAB IAB Ectopic Multiple Live Births  0 0 0 0 0       The following portions of the patient's history were reviewed and updated as appropriate: allergies, current medications, past family history, past medical history, past social history, past surgical history, and problem list.  Review of Systems  All other systems reviewed and are negative.   Past medical history, past surgical history, family history and social history were all reviewed and documented in the EPIC chart.  Exam:  Vitals:   10/09/24 0907  BP: 106/72  Weight: 253 lb (114.8 kg)   Body mass index is 39.04 kg/m.  Physical Exam Constitutional:      Appearance: Normal appearance. She is obese.  Pulmonary:     Effort: Pulmonary effort is normal.  Neurological:     Mental Status: She is alert.  Psychiatric:        Mood and Affect: Mood normal.        Thought Content: Thought content normal.        Judgment: Judgment normal.      Darice Hoit, CMA present for exam  Assessment/Plan:   1. Amenorrhea (Primary) Begin Provera  today. Call to schedule Cycle Day 3 labs. Schedule appt with [facility]- phone number provided.  - Pregnancy, urine; negative  - medroxyPROGESTERone  (PROVERA ) 10 MG tablet; Take 1 tablet (10 mg total) by mouth daily.  Dispense: 10 tablet;  Refill: 0  - Anti-Mullerian Hormone (AMH), Female; Future - Estradiol ; Future - Follicle stimulating hormone; Future - Hemoglobin A1c; Future - Luteinizing hormone; Future - TSH; Future - Prolactin; Future     GINETTE SHASTA NOVAK WHNP-BC 9:29 AM 10/09/2024

## 2024-10-29 ENCOUNTER — Telehealth: Payer: Self-pay | Admitting: *Deleted

## 2024-10-29 ENCOUNTER — Other Ambulatory Visit

## 2024-10-29 DIAGNOSIS — N912 Amenorrhea, unspecified: Secondary | ICD-10-CM

## 2024-10-29 LAB — TSH: TSH: 2.75 m[IU]/L

## 2024-10-29 LAB — HEMOGLOBIN A1C
Hgb A1c MFr Bld: 5.2 % (ref <5.7)
Mean Plasma Glucose: 103 mg/dL
eAG (mmol/L): 5.7 mmol/L

## 2024-10-29 LAB — ESTRADIOL: Estradiol: 33 pg/mL

## 2024-10-29 LAB — FOLLICLE STIMULATING HORMONE: FSH: 33.9 m[IU]/mL

## 2024-10-29 LAB — PROLACTIN: Prolactin: 14.5 ng/mL

## 2024-10-29 LAB — LUTEINIZING HORMONE: LH: 17.9 m[IU]/mL

## 2024-10-29 NOTE — Telephone Encounter (Signed)
 Patient in office for labs. Spoke with [employer]. Reports she took provera  10 mg x 10 days, menses started on 10/26/24.   OV 09/29/24, return for day 3 labs. Has future orders for AMH, estradiol , FSH, hemoglobin A1c, LH, TSH and Prolactin. Patient requesting to proceed with labs on day 4.  Advised ok to proceed. Will send update to covering provider, our office will f/u with any additional recommendations. Patient agreeable.   Routing to Dr. Glennon for final review.

## 2024-10-31 LAB — ANTI-MULLERIAN HORMONE (AMH), FEMALE: Anti-Mullerian Hormones(AMH), Female: 0.01 ng/mL — ABNORMAL LOW (ref 0.69–13.39)

## 2024-11-02 ENCOUNTER — Ambulatory Visit: Payer: Self-pay | Admitting: Nurse Practitioner

## 2024-11-28 ENCOUNTER — Ambulatory Visit: Admitting: Radiology

## 2024-12-06 ENCOUNTER — Ambulatory Visit: Payer: Self-pay | Admitting: Radiology

## 2024-12-13 ENCOUNTER — Ambulatory Visit: Payer: Self-pay | Admitting: Radiology
